# Patient Record
Sex: Male | Born: 1970
Health system: Southern US, Community
[De-identification: ages and names within clinical notes are randomized; demographics above are authoritative.]

## PROBLEM LIST (undated history)

## (undated) DIAGNOSIS — K589 Irritable bowel syndrome without diarrhea: Secondary | ICD-10-CM

## (undated) DIAGNOSIS — D649 Anemia, unspecified: Secondary | ICD-10-CM

## (undated) HISTORY — DX: Anemia, unspecified: D64.9

---

## 1998-09-04 ENCOUNTER — Emergency Department (HOSPITAL_COMMUNITY): Admission: EM | Admit: 1998-09-04 | Discharge: 1998-09-04 | Payer: Self-pay | Admitting: Emergency Medicine

## 2012-09-14 ENCOUNTER — Emergency Department (HOSPITAL_COMMUNITY)
Admission: EM | Admit: 2012-09-14 | Discharge: 2012-09-14 | Disposition: A | Discharge status: Home / Self Care | Payer: Federal, State, Local not specified - PPO | Attending: Emergency Medicine | Admitting: Emergency Medicine

## 2012-09-14 ENCOUNTER — Encounter (HOSPITAL_COMMUNITY): Payer: Self-pay | Admitting: Emergency Medicine

## 2012-09-14 DIAGNOSIS — K921 Melena: Secondary | ICD-10-CM | POA: Insufficient documentation

## 2012-09-14 DIAGNOSIS — K5900 Constipation, unspecified: Secondary | ICD-10-CM | POA: Insufficient documentation

## 2012-09-14 DIAGNOSIS — K625 Hemorrhage of anus and rectum: Secondary | ICD-10-CM | POA: Insufficient documentation

## 2012-09-14 DIAGNOSIS — Z8719 Personal history of other diseases of the digestive system: Secondary | ICD-10-CM | POA: Insufficient documentation

## 2012-09-14 DIAGNOSIS — K649 Unspecified hemorrhoids: Secondary | ICD-10-CM

## 2012-09-14 DIAGNOSIS — Z87891 Personal history of nicotine dependence: Secondary | ICD-10-CM | POA: Insufficient documentation

## 2012-09-14 HISTORY — DX: Irritable bowel syndrome, unspecified: K58.9

## 2012-09-14 LAB — COMPREHENSIVE METABOLIC PANEL
ALT: 34 U/L (ref 0–53)
AST: 29 U/L (ref 0–37)
Albumin: 3.6 g/dL (ref 3.5–5.2)
Alkaline Phosphatase: 123 U/L — ABNORMAL HIGH (ref 39–117)
BUN: 10 mg/dL (ref 6–23)
CO2: 25 mEq/L (ref 19–32)
Calcium: 8.7 mg/dL (ref 8.4–10.5)
Chloride: 104 mEq/L (ref 96–112)
Creatinine, Ser: 1 mg/dL (ref 0.50–1.35)
GFR calc Af Amer: >90 mL/min (ref >90)
GFR calc non Af Amer: >90 mL/min (ref >90)
Glucose, Bld: 98 mg/dL (ref 70–99)
Potassium: 3.5 mEq/L (ref 3.5–5.1)
Sodium: 137 mEq/L (ref 135–145)
Total Bilirubin: 0.2 mg/dL — ABNORMAL LOW (ref 0.3–1.2)
Total Protein: 7.3 g/dL (ref 6.0–8.3)

## 2012-09-14 LAB — CBC WITH DIFFERENTIAL/PLATELET
Basophils Absolute: 0 10*3/uL (ref 0.0–0.1)
Basophils Relative: 0 % (ref 0–1)
Eosinophils Absolute: 0.1 10*3/uL (ref 0.0–0.7)
Eosinophils Relative: 2 % (ref 0–5)
HCT: 35.2 % — ABNORMAL LOW (ref 39.0–52.0)
Hemoglobin: 12.1 g/dL — ABNORMAL LOW (ref 13.0–17.0)
Lymphocytes Relative: 45 % (ref 12–46)
Lymphs Abs: 2.8 10*3/uL (ref 0.7–4.0)
MCH: 25.5 pg — ABNORMAL LOW (ref 26.0–34.0)
MCHC: 34.4 g/dL (ref 30.0–36.0)
MCV: 74.1 fL — ABNORMAL LOW (ref 78.0–100.0)
Monocytes Absolute: 0.6 10*3/uL (ref 0.1–1.0)
Monocytes Relative: 10 % (ref 3–12)
Neutro Abs: 2.7 10*3/uL (ref 1.7–7.7)
Neutrophils Relative %: 43 % (ref 43–77)
Platelets: 249 10*3/uL (ref 150–400)
RBC: 4.75 MIL/uL (ref 4.22–5.81)
RDW: 15.5 % (ref 11.5–15.5)
WBC: 6.3 10*3/uL (ref 4.0–10.5)

## 2012-09-14 LAB — OCCULT BLOOD, POC DEVICE: Fecal Occult Bld: POSITIVE — AB

## 2012-09-14 MED ORDER — POLYETHYLENE GLYCOL 3350 17 GM/SCOOP PO POWD: 17.0000 g | Freq: Every day | ORAL | Status: DC

## 2012-09-14 NOTE — ED Notes (Signed)
Pt has been constipated and began having rectal bleeding about 3 days ago.  Pt states that he thinks the bleeding came from straining and that there was only a little blood on his feces at first but then when his BM was soft, and it did not hurt to go, he still had blood on the tissue.

## 2012-09-14 NOTE — ED Provider Notes (Signed)
History     CSN: [id number]  Arrival date & time 09/14/12  1544   First MD Initiated Contact with Patient 09/14/12 1649      Chief Complaint  Patient presents with  . Rectal Bleeding    (Consider location/radiation/quality/duration/timing/severity/associated sxs/prior treatment) HPI  41 year old male with history of IBS presents with rectal bleeding. Patient reports he has had intermittent bright red blood per rectum for the past 3 or 4 days. Notes that he's been having some constipation and has to strain when he having bowel movement. He notice trace of bright red blood in the toilet bowl. Quantify as maybe 1-2 teaspoon at one time. This occurred each time he had a bowel movement in which he usually has one bowel movement daily. Yesterday he had a normal bowel movement without straining and yet he saw some blood in it. He does complain of some discomfort when he wipes.  Today he noticed some blood on the tissue paper. Otherwise patient denies fever, chills, lightheadedness, dizziness, chest pain, shortness of breath, abdominal pain, nausea vomiting or diarrhea. He denies any recent trauma. He has no history of hemorrhoids. Patient denies any abnormal weight changes.  Past Medical History  Diagnosis Date  . IBS (irritable bowel syndrome)     History reviewed. No pertinent past surgical history.  History reviewed. No pertinent family history.  History  Substance Use Topics  . Smoking status: Former Games developer  . Smokeless tobacco: Not on file  . Alcohol Use: No      Review of Systems  Constitutional: Negative for fever, appetite change and unexpected weight change.  Gastrointestinal: Positive for constipation, blood in stool and rectal pain. Negative for nausea and vomiting.  Genitourinary: Negative for flank pain and testicular pain.  Neurological: Negative for numbness.    Allergies  Review of patient's allergies indicates no known allergies.  Home Medications   Current  Outpatient Rx  Name  Route  Sig  Dispense  Refill  . CLINDINIUM-CHLORDIAZEPOXIDE 2.5-5 MG PO CAPS   Oral   Take 1 capsule by mouth 3 (three) times daily as needed. Ulcer           BP 140/97  Pulse 88  Temp 98.1 F (36.7 C) (Oral)  Resp 18  SpO2 100%  Physical Exam  Nursing note and vitals reviewed. Constitutional: He appears well-developed and well-nourished. No distress.       Awake, alert, nontoxic appearance  HENT:  Head: Atraumatic.  Eyes: Conjunctivae normal are normal. Right eye exhibits no discharge. Left eye exhibits no discharge.  Neck: Normal range of motion. Neck supple.  Cardiovascular: Normal rate and regular rhythm.   Pulmonary/Chest: Effort normal. No respiratory distress. He exhibits no tenderness.  Abdominal: Soft. There is no tenderness. There is no rebound.  Genitourinary: Guaiac positive stool.       Rectum is mildly tender on exam. Some evidence of externally internal hemorrhoid without anal fissure or mass noted. Normal prostate. Hemoccult positive.  Musculoskeletal: He exhibits no tenderness.       ROM appears intact, no obvious focal weakness  Neurological: He is alert.  Skin: Skin is warm and dry. No rash noted.  Psychiatric: He has a normal mood and affect.    ED Course  Procedures (including critical care time)   Labs Reviewed  CBC WITH DIFFERENTIAL  COMPREHENSIVE METABOLIC PANEL   Results for orders placed during the hospital encounter of 09/14/12  CBC WITH DIFFERENTIAL      Component Value Range  WBC 6.3  4.0 - 10.5 K/uL   RBC 4.75  4.22 - 5.81 MIL/uL   Hemoglobin 12.1 (*) 13.0 - 17.0 g/dL   HCT 47.8 (*) 29.5 - 62.1 %   MCV 74.1 (*) 78.0 - 100.0 fL   MCH 25.5 (*) 26.0 - 34.0 pg   MCHC 34.4  30.0 - 36.0 g/dL   RDW 30.8  65.7 - 84.6 %   Platelets 249  150 - 400 K/uL   Neutrophils Relative 43  43 - 77 %   Neutro Abs 2.7  1.7 - 7.7 K/uL   Lymphocytes Relative 45  12 - 46 %   Lymphs Abs 2.8  0.7 - 4.0 K/uL   Monocytes Relative 10   3 - 12 %   Monocytes Absolute 0.6  0.1 - 1.0 K/uL   Eosinophils Relative 2  0 - 5 %   Eosinophils Absolute 0.1  0.0 - 0.7 K/uL   Basophils Relative 0  0 - 1 %   Basophils Absolute 0.0  0.0 - 0.1 K/uL  COMPREHENSIVE METABOLIC PANEL      Component Value Range   Sodium 137  135 - 145 mEq/L   Potassium 3.5  3.5 - 5.1 mEq/L   Chloride 104  96 - 112 mEq/L   CO2 25  19 - 32 mEq/L   Glucose, Bld 98  70 - 99 mg/dL   BUN 10  6 - 23 mg/dL   Creatinine, Ser 9.62  0.50 - 1.35 mg/dL   Calcium 8.7  8.4 - 95.2 mg/dL   Total Protein 7.3  6.0 - 8.3 g/dL   Albumin 3.6  3.5 - 5.2 g/dL   AST 29  0 - 37 U/L   ALT 34  0 - 53 U/L   Alkaline Phosphatase 123 (*) 39 - 117 U/L   Total Bilirubin 0.2 (*) 0.3 - 1.2 mg/dL   GFR calc non Af Amer >90  >90 mL/min   GFR calc Af Amer >90  >90 mL/min  OCCULT BLOOD, POC DEVICE      Component Value Range   Fecal Occult Bld POSITIVE (*) NEGATIVE   No results found.   1. Rectal bleeding  MDM  Patient complains of bright red blood per rectum. He did complain of some constipation and had to strain. On exam he does have evidence of internal and external hemorrhoid, minimal, with guaiac-positive.  He has history of IBS.  His hemoglobin today is 12.1 but otherwise lbs unremarkable. Mildly elevated alkaline phosphatase at 123 but he has no abdominal pain. I would like to refer patient to GI for further evaluation but I anticipate his rectal bleeding is secondary to hemorrhoids.  No significant risk factor for cancer. We'll discharge with stool softener. Patient voiced understanding and agrees with plan.        Fayrene Helper, PA-C 09/14/12 1835

## 2012-09-15 ENCOUNTER — Telehealth: Payer: Self-pay | Admitting: Internal Medicine

## 2012-09-15 NOTE — Telephone Encounter (Signed)
Pt reports he's anxious about the rectal bleeding and wants to make sure nothing's wrong. He prefers not to wait until scheduled visit with Dr Rhea Belton in January, 2014; pt was given an appt with Mike Gip, PA for 09/17/12.

## 2012-09-15 NOTE — ED Provider Notes (Signed)
Medical screening examination/treatment/procedure(s) were performed by non-physician practitioner and as supervising physician I was immediately available for consultation/collaboration.  Lemmie Vanlanen, MD 09/15/12 0028 

## 2012-09-15 NOTE — Telephone Encounter (Signed)
lmom for pt to call back

## 2012-09-17 ENCOUNTER — Ambulatory Visit (INDEPENDENT_AMBULATORY_CARE_PROVIDER_SITE_OTHER): Payer: Federal, State, Local not specified - PPO | Admitting: Physician Assistant

## 2012-09-17 ENCOUNTER — Encounter: Payer: Self-pay | Admitting: Physician Assistant

## 2012-09-17 ENCOUNTER — Other Ambulatory Visit (INDEPENDENT_AMBULATORY_CARE_PROVIDER_SITE_OTHER): Payer: Federal, State, Local not specified - PPO

## 2012-09-17 VITALS — BP 110/78 | HR 81 | Ht 68.0 in | Wt 205.8 lb

## 2012-09-17 DIAGNOSIS — K625 Hemorrhage of anus and rectum: Secondary | ICD-10-CM

## 2012-09-17 DIAGNOSIS — K589 Irritable bowel syndrome without diarrhea: Secondary | ICD-10-CM

## 2012-09-17 DIAGNOSIS — K582 Mixed irritable bowel syndrome: Secondary | ICD-10-CM | POA: Insufficient documentation

## 2012-09-17 DIAGNOSIS — D649 Anemia, unspecified: Secondary | ICD-10-CM

## 2012-09-17 LAB — IBC PANEL
Iron: 25 ug/dL — ABNORMAL LOW (ref 42–165)
Saturation Ratios: 5.8 % — ABNORMAL LOW (ref 20.0–50.0)
Transferrin: 307.5 mg/dL (ref 212.0–360.0)

## 2012-09-17 LAB — FERRITIN: Ferritin: 4.1 ng/mL — ABNORMAL LOW (ref 22.0–322.0)

## 2012-09-17 MED ORDER — MOVIPREP 100 G PO SOLR: 1.0000 | Freq: Once | ORAL | Status: AC

## 2012-09-17 NOTE — Patient Instructions (Addendum)
Please go to the basement level to have your labs drawn.   You have been scheduled for a colonoscopy with propofol. Please follow written instructions given to you at your visit today.  Please pick up your prep kit at the pharmacy within the next 1-3 days. We sent a prescription for the colonoscopy prep to [facility] [address].  We have given you a $20 rebate coupon. If you use inhalers (even only as needed) or a CPAP machine, please bring them with you on the day of your procedure.

## 2012-09-17 NOTE — Progress Notes (Addendum)
Subjective:    Patient ID: Danelle Earthly, male    DOB: 1971-05-29, 41 y.o.   MRN: [id number]  HPI Gedalya is a pleasant 41 year old African American male generally in good health who is new to GI today and referred after an ER visit earlier this week with rectal bleeding. He has history of IBS. He has not had any prior endoscopic evaluation. He said he had onset about a week ago of rectal bleeding which occurred after he had been constipated and had some straining. He says he started seeing some red blood mixed in with his bowel movements NSAID discontinued over a couple of days. On the third day he says he did not see any blood in the next day had a lot of bright red blood noted primarily on the tissue this is tapered off and now over the past couple of days he has not had any rectal bleeding and says his bowel movements are normal. He complains of mild rectal soreness but no rectal pain and says that the pain was never significant . He has no complaints of abdominal pain does get intermittent cramping and bloating with his IBS. His appetite has been fine his weight has been stable. He does donate blood once every 8 weeks and donated about a week before he had this episode of rectal bleeding. His family history is positive for maternal grandmother with polyps, there's no family history of colon cancer. His father does have sickle cell disease He was documented to be Hemoccult positive with his emergency room visit and had labs done showing hemoglobin of 12.1 hematocrit 35.2 MCV of 74 and MCH of 25 platelets 249 seen that was unremarkable with the exception of an alkaline phosphatase slightly elevated at 123    Review of Systems  Constitutional: Negative.   HENT: Negative.   Eyes: Negative.   Respiratory: Negative.   Cardiovascular: Negative.   Gastrointestinal: Positive for blood in stool and rectal pain.  Genitourinary: Negative.   Musculoskeletal: Negative.   Neurological: Negative.    Hematological: Negative.   Psychiatric/Behavioral: Negative.    Outpatient Prescriptions Prior to Visit  Medication Sig Dispense Refill  . clidinium-chlordiazePOXIDE (LIBRAX) 2.5-5 MG per capsule Take 1 capsule by mouth 3 (three) times daily as needed. Ulcer      . polyethylene glycol powder (GLYCOLAX/MIRALAX) powder Take 17 g by mouth daily.  255 g  0   Last reviewed on 09/17/2012  4:20 PM by Sammuel Cooper, PA  No Known Allergies  Patient Active Problem List  Diagnosis  . IBS (irritable bowel syndrome)      family history includes Colon polyps in his maternal grandmother; Diabetes in his maternal grandmother; Irritable bowel syndrome in his maternal grandmother and mother; and Sickle cell anemia in his father.  Objective:   Physical Exam well-developed African American male in no acute distress, pleasant blood pressure 110/78 pulse 81 height 5 foot 8 weight 205. HEENT; nontraumatic normocephalic EOMI PERRLA sclera anicteric conjunctiva are pink,neck; Supple no JVD, Cardiovascular; regular rate and rhythm with S1-S2 no murmur or gallop, Pulmonary; clear bilaterally, Abdomen ;soft nontender nondistended bowel sounds are active there is no palpable mass or hepatosplenomegaly, Rectal; exam not done, he was documented Hemoccult positive in the emergency room, Extremities; no clubbing cyanosis or edema skin warm and dry, Psych; mood and affect normal and appropriate.        Assessment & Plan:  #10 41 year old male with history of IBS now with several day history of rectal bleeding  with bright red blood mixed in with his bowel movements. At this point his bleeding has resolved. He does have a mild microcytic anemia. Bleeding it may have been anorectal secondary to small fissure however given family history and anemia will rule out a  colonic lesion.  Plan; check iron studies today Schedule for colonoscopy with Dr. Alona Bene was discussed in detail with the patient and he is  agreeable to proceed  Addendum: Reviewed and agree with initial management. Beverley Fiedler, MD

## 2012-09-20 ENCOUNTER — Other Ambulatory Visit: Payer: Self-pay | Admitting: *Deleted

## 2012-09-20 MED ORDER — INTEGRA PLUS PO CAPS: ORAL_CAPSULE | ORAL | Status: DC

## 2012-09-29 HISTORY — PX: COLONOSCOPY: SHX174

## 2012-10-08 ENCOUNTER — Ambulatory Visit: Payer: Federal, State, Local not specified - PPO | Admitting: Internal Medicine

## 2012-10-11 ENCOUNTER — Encounter: Payer: Self-pay | Admitting: Internal Medicine

## 2012-10-11 ENCOUNTER — Encounter: Payer: Federal, State, Local not specified - PPO | Admitting: Internal Medicine

## 2012-10-11 ENCOUNTER — Ambulatory Visit (AMBULATORY_SURGERY_CENTER): Payer: Federal, State, Local not specified - PPO | Admitting: Internal Medicine

## 2012-10-11 VITALS — BP 98/85 | HR 88 | Temp 96.8°F | Resp 49 | Ht 68.0 in | Wt 205.0 lb

## 2012-10-11 DIAGNOSIS — D126 Benign neoplasm of colon, unspecified: Secondary | ICD-10-CM

## 2012-10-11 DIAGNOSIS — D649 Anemia, unspecified: Secondary | ICD-10-CM

## 2012-10-11 DIAGNOSIS — K625 Hemorrhage of anus and rectum: Secondary | ICD-10-CM

## 2012-10-11 DIAGNOSIS — D509 Iron deficiency anemia, unspecified: Secondary | ICD-10-CM

## 2012-10-11 DIAGNOSIS — E611 Iron deficiency: Secondary | ICD-10-CM

## 2012-10-11 MED ORDER — SODIUM CHLORIDE 0.9 % IV SOLN: 500.0000 mL | INTRAVENOUS | Status: DC

## 2012-10-11 NOTE — Op Note (Signed)
[facility] [address] [place], [address]   COLONOSCOPY PROCEDURE REPORT  PATIENT: Justin [name], Justin [name]  MR#: [id number] BIRTHDATE: 1970/12/01 , 41  yrs. old GENDER: Male ENDOSCOPIST: Beverley Fiedler, MD REFERRED XB:[id number], Amy PROCEDURE DATE:  10/11/2012 PROCEDURE:   Colonoscopy with cold biopsy polypectomy ASA CLASS:   Class I INDICATIONS:Rectal Bleeding and Iron Deficiency Anemia. MEDICATIONS: MAC sedation, administered by CRNA and propofol (Diprivan) 450mg  IV  DESCRIPTION OF PROCEDURE:   After the risks benefits and alternatives of the procedure were thoroughly explained, informed consent was obtained.  A digital rectal exam revealed no rectal mass.   The LB [id number]-[id number]  endoscope was introduced through the anus and advanced to the cecum, which was identified by both the appendix and ileocecal valve. No adverse events experienced. The quality of the prep was Moviprep fair  The instrument was then slowly withdrawn as the colon was fully examined.    COLON FINDINGS: Three diminutive sessile polyps, measuring 2-3 mm in size, were found in the rectum.  Polypectomy was performed with cold forceps.  All resections were complete and all polyp tissue was completely retrieved.   The colon mucosa was otherwise normal. Retroflexed views revealed small internal hemorrhoids. The time to cecum=11 minutes 45 seconds.  Withdrawal time=14 minutes 13 seconds.  The scope was withdrawn and the procedure completed. COMPLICATIONS: There were no complications.   ENDOSCOPIC IMPRESSION: 1.   Three diminutive sessile polyps, measuring 2-3 mm in size, were found in the rectum; Polypectomy was performed with cold forceps 2.   The colon mucosa was otherwise normal 3.   Small internal hemorrhoids  RECOMMENDATIONS: 1.  Await pathology results 2.  Continue current medications, including oral iron supplementation 3.  Upper endoscopy will be scheduled given iron deficiency  anemia without current explanation   eSigned:  Beverley Fiedler, MD 10/11/2012 10:05 AM  cc: Kasandra Knudsen, The Patient, and Lilyan Punt, MD

## 2012-10-11 NOTE — Progress Notes (Signed)
Patient did not experience any of the following events: a burn prior to discharge; a fall within the facility; wrong site/side/patient/procedure/implant event; or a hospital transfer or hospital admission upon discharge from the facility. [phone]) Patient did not have preoperative order for IV antibiotic SSI prophylaxis. [phone])    Pt with firm abdomen, having problem passing air, pt repositioned right to left and back with some success. Remus Loffler x2 po per protocol. Pt did pass large amt of air, abdomen soft, non distended. ewm

## 2012-10-11 NOTE — Patient Instructions (Addendum)
YOU HAD AN ENDOSCOPIC PROCEDURE TODAY AT THE [facility]: Refer to the procedure report that was given to you for any specific questions about what was found during the examination.  If the procedure report does not answer your questions, please call your gastroenterologist to clarify.  If you requested that your care partner not be given the details of your procedure findings, then the procedure report has been included in a sealed envelope for you to review at your convenience later.  YOU SHOULD EXPECT: Some feelings of bloating in the abdomen. Passage of more gas than usual.  Walking can help get rid of the air that was put into your GI tract during the procedure and reduce the bloating. If you had a lower endoscopy (such as a colonoscopy or flexible sigmoidoscopy) you may notice spotting of blood in your stool or on the toilet paper. If you underwent a bowel prep for your procedure, then you may not have a normal bowel movement for a few days.  DIET: Your first meal following the procedure should be a light meal and then it is ok to progress to your normal diet.  A half-sandwich or bowl of soup is an example of a good first meal.  Heavy or fried foods are harder to digest and may make you feel nauseous or bloated.  Likewise meals heavy in dairy and vegetables can cause extra gas to form and this can also increase the bloating.  Drink plenty of fluids but you should avoid alcoholic beverages for 24 hours.  ACTIVITY: Your care partner should take you home directly after the procedure.  You should plan to take it easy, moving slowly for the rest of the day.  You can resume normal activity the day after the procedure however you should NOT DRIVE or use heavy machinery for 24 hours (because of the sedation medicines used during the test).    SYMPTOMS TO REPORT IMMEDIATELY: A gastroenterologist can be reached at any hour.  During normal business hours, 8:30 AM to 5:00 PM Monday through Friday,  call [phone].  After hours and on weekends, please call the GI answering service at [phone] (EMERGENCY NUMBER) who will take a message and have the physician on call contact you.   Following lower endoscopy (colonoscopy or flexible sigmoidoscopy):  Excessive amounts of blood in the stool  Significant tenderness or worsening of abdominal pains  Swelling of the abdomen that is new, acute  Fever of 100F or higher  FOLLOW UP: If any biopsies were taken you will be contacted by phone or by letter within the next 1-3 weeks.  Call your gastroenterologist if you have not heard about the biopsies in 3 weeks.  Our staff will call the home number listed on your records the next business day following your procedure to check on you and address any questions or concerns that you may have at that time regarding the information given to you following your procedure. This is a courtesy call and so if there is no answer at the home number and we have not heard from you through the emergency physician on call, we will assume that you have returned to your regular daily activities without incident.  SIGNATURES/CONFIDENTIALITY: You and/or your care partner have signed paperwork which will be entered into your electronic medical record.  These signatures attest to the fact that that the information above on your After Visit Summary has been reviewed and is understood.  Full responsibility of the confidentiality  of this discharge information lies with you and/or your care-partner.    Handout on polyps Continue current medicines including oral iron medicine Dr Lauro Franklin office will schedule an endoscopy and call you with the date and time

## 2012-10-12 ENCOUNTER — Telehealth: Payer: Self-pay | Admitting: *Deleted

## 2012-10-12 NOTE — Telephone Encounter (Signed)
  Follow up Call-  Call back number 10/11/2012  Post procedure Call Back phone  # [phone]  Permission to leave phone message Yes     Patient questions:  Do you have a fever, pain , or abdominal swelling? Pain Score  1 *  Have you tolerated food without any problems? yes  Have you been able to return to your normal activities? yes  Do you have any questions about your discharge instructions: Diet   no Medications  no Follow up visit  no  Do you have questions or concerns about your Care? no  Actions: * If pain score is 4 or above: No action needed, pain <4.pt. States that he has slight abd. Distention with "discomfort" noted at 1 on scale of 0-10.  He is able to eat without difficulty.  Advised To call office if discomfort or bloating become problematic.

## 2012-10-14 ENCOUNTER — Encounter: Payer: Self-pay | Admitting: Internal Medicine

## 2012-10-28 ENCOUNTER — Telehealth: Payer: Self-pay | Admitting: *Deleted

## 2012-10-28 NOTE — Telephone Encounter (Signed)
lmom for pt to call back. oer COLON on 10/11/12, pt needs to schedule and EGD d/t iron deficiency anemia.

## 2012-11-15 NOTE — Telephone Encounter (Signed)
Pt never called back. Mailed him a letter asking him to call us to schedule an EGD.

## 2013-01-29 ENCOUNTER — Encounter: Payer: Self-pay | Admitting: *Deleted

## 2013-08-30 ENCOUNTER — Encounter: Payer: Self-pay | Admitting: Family Medicine

## 2013-08-30 ENCOUNTER — Ambulatory Visit (INDEPENDENT_AMBULATORY_CARE_PROVIDER_SITE_OTHER): Payer: Federal, State, Local not specified - PPO | Admitting: Family Medicine

## 2013-08-30 VITALS — BP 132/94 | Ht 68.0 in | Wt 221.0 lb

## 2013-08-30 DIAGNOSIS — K589 Irritable bowel syndrome without diarrhea: Secondary | ICD-10-CM

## 2013-08-30 MED ORDER — CILIDINIUM-CHLORDIAZEPOXIDE 2.5-5 MG PO CAPS: 1.0000 | ORAL_CAPSULE | Freq: Three times a day (TID) | ORAL | Status: DC | PRN

## 2013-08-30 NOTE — Progress Notes (Signed)
   Subjective:    Patient ID: Justin [name], male    DOB: April 18, 1971, 42 y.o.   MRN: [id number]  HPI Patient is here today for a check up. Ongoing flares severe double bowel syndrome. When the strike severe pain. Crampy in nature. Often accompanied by loose stools. At times has to come in late to work because of these. At times has to miss work because of severe pain.  He states he has been having stress in his life, but other than that everything is OK.   Went back to smoking after off for awhile. E cigs helped.   Keeps active with paper route,working at post office, was exercising but fell off with it.   Vit supp  Review of Systems No fever no chills no vomiting no rash no shortness of breath no chest pain ROS otherwise negative    Objective:   Physical Exam Alert no apparent distress vitals stable blood pressure 1:30 or 70 6 repeat. Lungs clear heart regular rate and rhythm H&T normal abdomen soft no discrete tenderness no rebound or guarding       Assessment & Plan:  Impression irritable bowel syndrome rather severe in nature. At times necessitating absence from work. Plan medications refilled. Check every 6 months on this. Also wellness exam encourage. WSL

## 2013-10-14 ENCOUNTER — Ambulatory Visit (INDEPENDENT_AMBULATORY_CARE_PROVIDER_SITE_OTHER): Payer: Federal, State, Local not specified - PPO | Admitting: Family Medicine

## 2013-10-14 ENCOUNTER — Encounter: Payer: Self-pay | Admitting: Family Medicine

## 2013-10-14 VITALS — BP 136/88 | Ht 68.5 in | Wt 219.0 lb

## 2013-10-14 DIAGNOSIS — Z0000 Encounter for general adult medical examination without abnormal findings: Secondary | ICD-10-CM

## 2013-10-14 DIAGNOSIS — Z125 Encounter for screening for malignant neoplasm of prostate: Secondary | ICD-10-CM

## 2013-10-14 DIAGNOSIS — E782 Mixed hyperlipidemia: Secondary | ICD-10-CM

## 2013-10-14 MED ORDER — TRIAMCINOLONE ACETONIDE 0.1 % EX CREA: 1.0000 "application " | TOPICAL_CREAM | Freq: Two times a day (BID) | CUTANEOUS | Status: DC

## 2013-10-14 NOTE — Progress Notes (Signed)
   Subjective:    Patient ID: Justin [name], male    DOB: [birth year], 43 y.o.   MRN: [id number]  HPI Patient arrives for an annual wellness exam. Trying to watch salt  Gets fair amnt of exercise with his jobs. Uses sea salt  Patient stated the last time he gave blood they told him his BP was high and also having problems with fatigue at times.  Patient not exercising regularly other than with work.  Patient states irritable bowel syndrome is generally stable at this time. With flares once a month or so.    Also states the eczema in his ears has gotten bad again and needs med called in.  Pt not a flu shot person  Claims decent variety of foods.  No early family history of prostate or colon cancer. Review of Systems  Constitutional: Negative for fever, activity change and appetite change.  HENT: Negative for congestion and rhinorrhea.   Eyes: Negative for discharge.  Respiratory: Negative for cough and wheezing.   Cardiovascular: Negative for chest pain.  Gastrointestinal: Negative for vomiting, abdominal pain and blood in stool.  Genitourinary: Negative for frequency and difficulty urinating.  Musculoskeletal: Negative for neck pain.  Skin: Negative for rash.  Allergic/Immunologic: Negative for environmental allergies and food allergies.  Neurological: Negative for weakness and headaches.  Psychiatric/Behavioral: Negative for agitation.  All other systems reviewed and are negative.       Objective:   Physical Exam  Vitals reviewed. Constitutional: He appears well-developed and well-nourished.  HENT:  Head: Normocephalic and atraumatic.  Right Ear: External ear normal.  Left Ear: External ear normal.  Nose: Nose normal.  Mouth/Throat: Oropharynx is clear and moist.  Eyes: EOM are normal. Pupils are equal, round, and reactive to light.  Neck: Normal range of motion. Neck supple. No thyromegaly present.  Cardiovascular: Normal rate, regular rhythm and normal heart  sounds.   No murmur heard. Pulmonary/Chest: Effort normal and breath sounds normal. No respiratory distress. He has no wheezes.  Abdominal: Soft. Bowel sounds are normal. He exhibits no distension and no mass. There is no tenderness.  Genitourinary: Penis normal.  Musculoskeletal: Normal range of motion. He exhibits no edema.  Lymphadenopathy:    He has no cervical adenopathy.  Neurological: He is alert. He exhibits normal muscle tone.  Skin: Skin is warm and dry. No erythema.  Psychiatric: He has a normal mood and affect. His behavior is normal. Judgment normal.          Assessment & Plan:  Impression #1 wellness exam #2 borderline blood pressure blood pressure currently good on repeat #3 eczema. #4 herbal bowel syndrome. Plan appropriate blood work. Diet exercise discussed. Check every 6 months with irritable bowel. WSL

## 2013-10-26 DIAGNOSIS — Z0289 Encounter for other administrative examinations: Secondary | ICD-10-CM

## 2013-11-14 LAB — LIPID PANEL
Cholesterol: 126 mg/dL (ref 0–200)
HDL: 37 mg/dL — ABNORMAL LOW (ref >39)
LDL Cholesterol: 69 mg/dL (ref 0–99)
Total CHOL/HDL Ratio: 3.4 Ratio
Triglycerides: 102 mg/dL (ref <150)
VLDL: 20 mg/dL (ref 0–40)

## 2013-11-14 LAB — GLUCOSE, RANDOM: Glucose, Bld: 111 mg/dL — ABNORMAL HIGH (ref 70–99)

## 2013-11-15 LAB — PSA: PSA: 0.8 ng/mL (ref <=4.00)

## 2013-11-16 ENCOUNTER — Encounter: Payer: Self-pay | Admitting: Family Medicine

## 2013-11-21 ENCOUNTER — Other Ambulatory Visit: Payer: Self-pay | Admitting: Family Medicine

## 2013-11-21 MED ORDER — CILIDINIUM-CHLORDIAZEPOXIDE 2.5-5 MG PO CAPS: ORAL_CAPSULE | ORAL | Status: DC

## 2013-11-21 NOTE — Addendum Note (Signed)
Addended by: Jesusita Oka on: 11/21/2013 04:25 PM   Modules accepted: Orders

## 2014-02-06 ENCOUNTER — Other Ambulatory Visit: Payer: Self-pay | Admitting: Family Medicine

## 2014-02-06 NOTE — Telephone Encounter (Signed)
Last seen 1/16

## 2014-05-16 ENCOUNTER — Other Ambulatory Visit: Payer: Self-pay | Admitting: *Deleted

## 2014-05-16 ENCOUNTER — Other Ambulatory Visit: Payer: Self-pay | Admitting: Family Medicine

## 2014-05-26 ENCOUNTER — Other Ambulatory Visit: Payer: Self-pay | Admitting: Family Medicine

## 2014-06-04 ENCOUNTER — Emergency Department (INDEPENDENT_AMBULATORY_CARE_PROVIDER_SITE_OTHER)
Admission: EM | Admit: 2014-06-04 | Discharge: 2014-06-04 | Disposition: A | Discharge status: Home / Self Care | Payer: Federal, State, Local not specified - PPO | Source: Home / Self Care | Attending: Family Medicine | Admitting: Family Medicine

## 2014-06-04 ENCOUNTER — Encounter (HOSPITAL_COMMUNITY): Payer: Self-pay | Admitting: Emergency Medicine

## 2014-06-04 DIAGNOSIS — K0889 Other specified disorders of teeth and supporting structures: Secondary | ICD-10-CM

## 2014-06-04 DIAGNOSIS — I10 Essential (primary) hypertension: Secondary | ICD-10-CM

## 2014-06-04 DIAGNOSIS — K089 Disorder of teeth and supporting structures, unspecified: Secondary | ICD-10-CM

## 2014-06-04 MED ORDER — HYDROCHLOROTHIAZIDE 12.5 MG PO TABS: 12.5000 mg | ORAL_TABLET | Freq: Every day | ORAL | Status: DC

## 2014-06-04 MED ORDER — TRAMADOL HCL 50 MG PO TABS: 50.0000 mg | ORAL_TABLET | Freq: Four times a day (QID) | ORAL | Status: DC | PRN

## 2014-06-04 MED ORDER — AMOXICILLIN 500 MG PO CAPS: 500.0000 mg | ORAL_CAPSULE | Freq: Three times a day (TID) | ORAL | Status: DC

## 2014-06-04 NOTE — Discharge Instructions (Signed)
Thank you for coming in today. Followup with a dentist as soon as possible.  Take tramadol for severe pain.  Take amoxicillin to combat infection.  Start taking hydrochlorothiazide for elevated blood pressure.  Followup with your primary care Dr..  Call or go to the emergency room if you get worse, have trouble breathing, have chest pains, or palpitations.    Justin:  [name] [phone] (ext [phone])  [place]  Please call Dr. Donn Pierini office [phone] or cell [phone] [address] [place] for tooth removal $200 includes exam, Xray, and extraction and follow up visit.  Bring list of current medications with you.   [facility]  [address] [facility]  [place], [place], [place], [address]  [phone], Ext. [phone]  2nd and 4th Thursday of the month at 6:30am (Simple extractions only - no wisdom teeth or surgery) First come/First serve -First 10 clients served  [facility], [place] and [place] residents only)  [address] [place], [place], [address]  [phone]  [place]  [phone]  [facility]  [phone]  Please call [facility] at [phone] to get the details to get your tooth pulled.    Dental Pain A tooth ache may be caused by cavities (tooth decay). Cavities expose the nerve of the tooth to air and hot or cold temperatures. It may come from an infection or abscess (also called a boil or furuncle) around your tooth. It is also often caused by dental caries (tooth decay). This causes the pain you are having. DIAGNOSIS  Your caregiver can diagnose this problem by exam. TREATMENT   If caused by an infection, it may be treated with medications which kill  germs (antibiotics) and pain medications as prescribed by your caregiver. Take medications as directed.  Only take over-the-counter or prescription medicines for pain, discomfort, or fever as directed by your caregiver.  Whether the tooth ache today is caused by infection or dental disease, you should see your dentist as soon as possible for further care. SEEK MEDICAL CARE IF: The exam and treatment you received today has been provided on an emergency basis only. This is not a substitute for complete medical or dental care. If your problem worsens or new problems (symptoms) appear, and you are unable to meet with your dentist, call or return to this location. SEEK IMMEDIATE MEDICAL CARE IF:   You have a fever.  You develop redness and swelling of your face, jaw, or neck.  You are unable to open your mouth.  You have severe pain uncontrolled by pain medicine. MAKE SURE YOU:   Understand these instructions.  Will watch your condition.  Will get help right away if you are not doing well or get worse. Document Released: 09/15/2005 Document Revised: 12/08/2011 Document Reviewed: 05/03/2008 [facility] Patient Information 2015 [place], [place]. This information is not intended to replace advice given to you by your health care provider. Make sure you discuss any questions you have with your health care provider.  Hypertension Hypertension, commonly called high blood pressure, is when the force of blood pumping through your arteries is too strong. Your arteries are the blood vessels that carry blood from your heart throughout your body. A blood pressure reading consists of  a higher number over a lower number, such as 110/72. The higher number (systolic) is the pressure inside your arteries when your heart pumps. The lower number (diastolic) is the pressure inside your arteries when your heart relaxes. Ideally you want your blood pressure below 120/80. Hypertension forces your heart to work harder to  pump blood. Your arteries may become narrow or stiff. Having hypertension puts you at risk for heart disease, stroke, and other problems.  RISK FACTORS Some risk factors for high blood pressure are controllable. Others are not.  Risk factors you cannot control include:   Race. You may be at higher risk if you are African American.  Age. Risk increases with age.  Gender. Men are at higher risk than women before age 68 years. After age 93, women are at higher risk than men. Risk factors you can control include:  Not getting enough exercise or physical activity.  Being overweight.  Getting too much fat, sugar, calories, or salt in your diet.  Drinking too much alcohol. SIGNS AND SYMPTOMS Hypertension does not usually cause signs or symptoms. Extremely high blood pressure (hypertensive crisis) may cause headache, anxiety, shortness of breath, and nosebleed. DIAGNOSIS  To check if you have hypertension, your health care provider will measure your blood pressure while you are seated, with your arm held at the level of your heart. It should be measured at least twice using the same arm. Certain conditions can cause a difference in blood pressure between your right and left arms. A blood pressure reading that is higher than normal on one occasion does not mean that you need treatment. If one blood pressure reading is high, ask your health care provider about having it checked again. TREATMENT  Treating high blood pressure includes making lifestyle changes and possibly taking medicine. Living a healthy lifestyle can help lower high blood pressure. You may need to change some of your habits. Lifestyle changes may include:  Following the DASH diet. This diet is high in fruits, vegetables, and whole grains. It is low in salt, red meat, and added sugars.  Getting at least 2 hours of brisk physical activity every week.  Losing weight if necessary.  Not smoking.  Limiting alcoholic  beverages.  Learning ways to reduce stress. If lifestyle changes are not enough to get your blood pressure under control, your health care provider may prescribe medicine. You may need to take more than one. Work closely with your health care provider to understand the risks and benefits. HOME CARE INSTRUCTIONS  Have your blood pressure rechecked as directed by your health care provider.   Take medicines only as directed by your health care provider. Follow the directions carefully. Blood pressure medicines must be taken as prescribed. The medicine does not work as well when you skip doses. Skipping doses also puts you at risk for problems.   Do not smoke.   Monitor your blood pressure at home as directed by your health care provider. SEEK MEDICAL CARE IF:   You think you are having a reaction to medicines taken.  You have recurrent headaches or feel dizzy.  You have swelling in your ankles.  You have trouble with your vision. SEEK IMMEDIATE MEDICAL CARE IF:  You develop a severe headache or confusion.  You have unusual weakness, numbness, or feel faint.  You have severe chest or abdominal pain.  You vomit repeatedly.  You have trouble breathing. MAKE SURE YOU:   Understand these instructions.  Will watch your condition.  Will get help right away if you are not doing well or get worse. Document Released: 09/15/2005 Document Revised: 01/30/2014 Document Reviewed: 07/08/2013 [facility] Patient Information 2015 [place], [place]. This information is not intended to replace advice given to you by your health care provider. Make sure you discuss any questions you have with your health care provider.

## 2014-06-04 NOTE — ED Provider Notes (Signed)
Justin [name] is a 43 y.o. male who presents to Urgent Care today for mouth pain. Patient has upper incisor pain present for the last several weeks. He's tried peroxide mouthwash which has not helped. He's taking a lot of ibuprofen which helped only a little. He is an appointment with a new dentist in about 3 weeks. No fevers or chills nausea vomiting or diarrhea. No history of hypertension. No chest pains palpitations shortness of breath.   Past Medical History  Diagnosis Date  . IBS (irritable bowel syndrome)   . Anemia    History  Substance Use Topics  . Smoking status: Current Every Day Smoker  . Smokeless tobacco: Never Used  . Alcohol Use: Yes     Comment: occasionally   ROS as above Medications: No current facility-administered medications for this encounter.   Current Outpatient Prescriptions  Medication Sig Dispense Refill  . amoxicillin (AMOXIL) 500 MG capsule Take 1 capsule (500 mg total) by mouth 3 (three) times daily.  30 capsule  0  . hydrochlorothiazide (HYDRODIURIL) 12.5 MG tablet Take 1 tablet (12.5 mg total) by mouth daily.  30 tablet  1  . traMADol (ULTRAM) 50 MG tablet Take 1 tablet (50 mg total) by mouth every 6 (six) hours as needed.  15 tablet  0  . [DISCONTINUED] clidinium-chlordiazePOXIDE (LIBRAX) 5-2.5 MG per capsule TAKE ONE CAPSULE BY MOUTH TWICE A DAY  180 capsule  0    Exam:  BP 166/110  Pulse 63  Temp(Src) 97.7 F (36.5 C) (Oral)  Resp 14  SpO2 100% Gen: Well NAD HEENT: EOMI,  MMM, cracked tooth #10 (upper left lateral incisor) with mild, gum line erythema. The tooth is tender to touch. Lungs: Normal work of breathing. CTABL Heart: RRR no MRG Abd: NABS, Soft. Nondistended, Nontender Exts: Brisk capillary refill, warm and well perfused.   I-STAT chemistry 8 results show sodium of 142, potassium 4, chloride 108, bicarbonate 25, glucose 96, BUN 20, creatinine 1.2, hemoglobin 16.3  No results found for this or any previous visit (from the past  24 hour(s)). No results found.  Assessment and Plan: 43 y.o. male with  1) dental pain due to cracked tooth. Treat with amoxicillin and tramadol. Followup with dentist 2) hypertension: Start hydrochlorothiazide. Avoid NSAIDs if possible.  Discussed warning signs or symptoms. Please see discharge instructions. Patient expresses understanding.   This note was created using Systems analyst. Any transcription errors are unintended.    Gregor Hams, MD 06/04/14 [phone]

## 2014-06-04 NOTE — ED Notes (Signed)
C/O left upper toothache x 3 wks; had regular f/u appt with dentist 2 wks ago - was told not infected, had a cut, cleaned out and felt better.  A few days later, pain returned.  Now pain spreading to nose, area tender to touch.  Denies fevers.  Denies hx HTN "but it's been a little high the last few times I donated blood".  Has been taking 600mg  IBU and rinsing with H2O2.

## 2014-06-06 LAB — POCT I-STAT, CHEM 8
BUN: 20 mg/dL (ref 6–23)
Calcium, Ion: 1.18 mmol/L (ref 1.12–1.23)
Chloride: 108 mEq/L (ref 96–112)
Creatinine, Ser: 1.2 mg/dL (ref 0.50–1.35)
Glucose, Bld: 96 mg/dL (ref 70–99)
HCT: 48 % (ref 39.0–52.0)
Hemoglobin: 16.3 g/dL (ref 13.0–17.0)
Potassium: 4 mEq/L (ref 3.7–5.3)
Sodium: 142 mEq/L (ref 137–147)
TCO2: 25 mmol/L (ref 0–100)

## 2014-08-29 ENCOUNTER — Other Ambulatory Visit: Payer: Self-pay | Admitting: Family Medicine

## 2014-08-30 NOTE — Telephone Encounter (Signed)
Pt to be seen in two d

## 2014-08-30 NOTE — Telephone Encounter (Signed)
Not on med list- last seen 1/15

## 2014-08-31 ENCOUNTER — Other Ambulatory Visit: Payer: Self-pay

## 2014-08-31 MED ORDER — CILIDINIUM-CHLORDIAZEPOXIDE 2.5-5 MG PO CAPS: 1.0000 | ORAL_CAPSULE | Freq: Two times a day (BID) | ORAL | Status: DC

## 2014-08-31 NOTE — Telephone Encounter (Signed)
Ck and see if pt wtill has appt tod or tom, if so will ref then

## 2014-09-01 ENCOUNTER — Encounter: Payer: Self-pay | Admitting: Family Medicine

## 2014-09-01 ENCOUNTER — Ambulatory Visit (INDEPENDENT_AMBULATORY_CARE_PROVIDER_SITE_OTHER): Payer: Federal, State, Local not specified - PPO | Admitting: Family Medicine

## 2014-09-01 VITALS — BP 122/72 | Ht 69.0 in | Wt 213.0 lb

## 2014-09-01 DIAGNOSIS — K589 Irritable bowel syndrome without diarrhea: Secondary | ICD-10-CM

## 2014-09-01 MED ORDER — CILIDINIUM-CHLORDIAZEPOXIDE 2.5-5 MG PO CAPS: 1.0000 | ORAL_CAPSULE | Freq: Two times a day (BID) | ORAL | Status: DC

## 2014-09-01 NOTE — Progress Notes (Signed)
   Subjective:    Patient ID: Justin [name], male    DOB: 12-17-1970, 43 y.o.   MRN: [id number]  HPI Patient is here today for a check up on his IBS.  He said it has been flaring up here lately due to stress.  Now working 2 jobs, feeling fatigued.   More smoking, not exercising as much  internmit abd pain  Librax helps some No longer on the hctz , started on it for elev bp when in the er for dentl psin    Review of Systems No headache no back pain no change in bowel habits no blood in stool ROS otherwise negative    Objective:   Physical Exam  Word vital stable lungs clear. Heart regular in rhythm. H&T normal. Blood pressure excellent off the medications. Abdominal exam benign      Assessment & Plan:  Impression 1 irritable bowel syndrome stable on meds. #2 elevated blood pressure discussed now resolved plan diet exercise discussed medications refilled. Don't forget wellness exams. WSL

## 2014-10-06 ENCOUNTER — Other Ambulatory Visit: Payer: Self-pay | Admitting: Family Medicine

## 2014-10-17 ENCOUNTER — Encounter: Payer: Self-pay | Admitting: Family Medicine

## 2014-10-17 ENCOUNTER — Ambulatory Visit (INDEPENDENT_AMBULATORY_CARE_PROVIDER_SITE_OTHER): Payer: Federal, State, Local not specified - PPO | Admitting: Family Medicine

## 2014-10-17 VITALS — BP 110/70 | Temp 98.2°F | Ht 69.0 in | Wt 211.0 lb

## 2014-10-17 DIAGNOSIS — Z125 Encounter for screening for malignant neoplasm of prostate: Secondary | ICD-10-CM | POA: Diagnosis not present

## 2014-10-17 DIAGNOSIS — K589 Irritable bowel syndrome without diarrhea: Secondary | ICD-10-CM | POA: Diagnosis not present

## 2014-10-17 DIAGNOSIS — Z13220 Encounter for screening for lipoid disorders: Secondary | ICD-10-CM

## 2014-10-17 DIAGNOSIS — Z139 Encounter for screening, unspecified: Secondary | ICD-10-CM

## 2014-10-17 DIAGNOSIS — Z0289 Encounter for other administrative examinations: Secondary | ICD-10-CM

## 2014-10-17 NOTE — Progress Notes (Signed)
   Subjective:    Patient ID: Justin [name], male    DOB: 11/11/1970, 44 y.o.   MRN: [id number]  HPI Patient is here today for a follow up visit on his irritable bowel syndrome.  Patient states that his current medication therapy is working. No other concerns at this time.   Patient needs his FMLA papers updated as a result of today's visit.   Jan to [place] coverage with the fmla,  Needs two visits per yr to make it legal Wonder about a good tine for a wellness exam  Would like to do in April Will work harder on diet and exercise by then  ibs tends to flare up more during  ibs flare  ovrall eating better  Pt notes fatigue and tiredness when working steadily  workign  ng forty plus , During a flare hs to miss a couple days of work, none recently in a few months     Review of Systems No headache no chest pain no weight loss no change in bowel habits ongoing loose stools with stress    Objective:   Physical Exam  Alert vitals stable no acute distress. Lungs clear heart regular rhythm abdominal exam benign      Assessment & Plan:  Impression chronic IBS with intermittent flares. At times requiring time out of work. Plan to fill out FMLA. Diet exercise discussed. Maintain same medications. Follow-up wellness exam in several months. WSL

## 2015-01-18 ENCOUNTER — Encounter: Payer: Federal, State, Local not specified - PPO | Admitting: Family Medicine

## 2015-01-25 ENCOUNTER — Other Ambulatory Visit: Payer: Self-pay | Admitting: Family Medicine

## 2015-01-25 NOTE — Telephone Encounter (Signed)
Please forward to Dr. Lance Morin not familiar with this patient

## 2015-01-26 NOTE — Telephone Encounter (Signed)
Ok plus one ref 

## 2015-02-19 ENCOUNTER — Other Ambulatory Visit: Payer: Self-pay | Admitting: Family Medicine

## 2015-03-10 LAB — LIPID PANEL
Cholesterol: 134 mg/dL (ref 0–200)
HDL: 37 mg/dL — ABNORMAL LOW (ref >=40)
LDL Cholesterol: 82 mg/dL (ref 0–99)
Total CHOL/HDL Ratio: 3.6 Ratio
Triglycerides: 76 mg/dL (ref <150)
VLDL: 15 mg/dL (ref 0–40)

## 2015-03-10 LAB — BASIC METABOLIC PANEL
BUN: 13 mg/dL (ref 6–23)
CO2: 27 mEq/L (ref 19–32)
Calcium: 9.1 mg/dL (ref 8.4–10.5)
Chloride: 105 mEq/L (ref 96–112)
Creat: 1.1 mg/dL (ref 0.50–1.35)
Glucose, Bld: 91 mg/dL (ref 70–99)
Potassium: 4 mEq/L (ref 3.5–5.3)
Sodium: 140 mEq/L (ref 135–145)

## 2015-03-10 LAB — HEPATIC FUNCTION PANEL
ALT: 22 U/L (ref 0–53)
AST: 18 U/L (ref 0–37)
Albumin: 3.9 g/dL (ref 3.5–5.2)
Alkaline Phosphatase: 118 U/L — ABNORMAL HIGH (ref 39–117)
Bilirubin, Direct: 0.1 mg/dL (ref 0.0–0.3)
Indirect Bilirubin: 0.4 mg/dL (ref 0.2–1.2)
Total Bilirubin: 0.5 mg/dL (ref 0.2–1.2)
Total Protein: 7.3 g/dL (ref 6.0–8.3)

## 2015-03-10 LAB — PSA: PSA: 0.69 ng/mL (ref <=4.00)

## 2015-03-21 ENCOUNTER — Ambulatory Visit (INDEPENDENT_AMBULATORY_CARE_PROVIDER_SITE_OTHER): Payer: Federal, State, Local not specified - PPO | Admitting: Family Medicine

## 2015-03-21 ENCOUNTER — Encounter: Payer: Self-pay | Admitting: Family Medicine

## 2015-03-21 VITALS — BP 120/80 | Ht 68.0 in | Wt 207.2 lb

## 2015-03-21 DIAGNOSIS — K589 Irritable bowel syndrome without diarrhea: Secondary | ICD-10-CM | POA: Diagnosis not present

## 2015-03-21 DIAGNOSIS — Z0000 Encounter for general adult medical examination without abnormal findings: Secondary | ICD-10-CM | POA: Diagnosis not present

## 2015-03-21 DIAGNOSIS — K921 Melena: Secondary | ICD-10-CM | POA: Diagnosis not present

## 2015-03-21 MED ORDER — CILIDINIUM-CHLORDIAZEPOXIDE 2.5-5 MG PO CAPS: 1.0000 | ORAL_CAPSULE | Freq: Two times a day (BID) | ORAL | Status: DC

## 2015-03-21 NOTE — Patient Instructions (Addendum)
miralax one scoop daily as needed for constipation Results for orders placed or performed in visit on 10/17/14  Lipid panel  Result Value Ref Range   Cholesterol 134 0 - 200 mg/dL   Triglycerides 76 <150 mg/dL   HDL 37 (L) >=40 mg/dL   Total CHOL/HDL Ratio 3.6 Ratio   VLDL 15 0 - 40 mg/dL   LDL Cholesterol 82 0 - 99 mg/dL  Hepatic function panel  Result Value Ref Range   Total Bilirubin 0.5 0.2 - 1.2 mg/dL   Bilirubin, Direct 0.1 0.0 - 0.3 mg/dL   Indirect Bilirubin 0.4 0.2 - 1.2 mg/dL   Alkaline Phosphatase 118 (H) 39 - 117 U/L   AST 18 0 - 37 U/L   ALT 22 0 - 53 U/L   Total Protein 7.3 6.0 - 8.3 g/dL   Albumin 3.9 3.5 - 5.2 g/dL  Basic metabolic panel  Result Value Ref Range   Sodium 140 135 - 145 mEq/L   Potassium 4.0 3.5 - 5.3 mEq/L   Chloride 105 96 - 112 mEq/L   CO2 27 19 - 32 mEq/L   Glucose, Bld 91 70 - 99 mg/dL   BUN 13 6 - 23 mg/dL   Creat 1.10 0.50 - 1.35 mg/dL   Calcium 9.1 8.4 - 10.5 mg/dL  PSA  Result Value Ref Range   PSA 0.69 <=4.00 ng/mL

## 2015-03-21 NOTE — Addendum Note (Signed)
Addended by: Jesusita Oka on: 03/21/2015 04:08 PM   Modules accepted: Orders

## 2015-03-21 NOTE — Progress Notes (Addendum)
Subjective:    Patient ID: Justin [name], male    DOB: 1971/01/31, 44 y.o.   MRN: [id number]  HPI The patient comes in today for a wellness visit.    A review of their health history was completed.  A review of medications was also completed.  Any needed refills: none   Eating habits: good  Falls/  MVA accidents in past few months: none  Regular exercise: yes, fairly regular and trying not to miss a session, three to four times per wk   Specialist pt sees on regular basis: none  Preventative health issues were discussed.   Additional concerns: Patient states that his bowel movements have been hard over the past month. Rectal bleeding was noted on a few occasions.   Results for orders placed or performed in visit on 10/17/14  Lipid panel  Result Value Ref Range   Cholesterol 134 0 - 200 mg/dL   Triglycerides 76 <150 mg/dL   HDL 37 (L) >=40 mg/dL   Total CHOL/HDL Ratio 3.6 Ratio   VLDL 15 0 - 40 mg/dL   LDL Cholesterol 82 0 - 99 mg/dL  Hepatic function panel  Result Value Ref Range   Total Bilirubin 0.5 0.2 - 1.2 mg/dL   Bilirubin, Direct 0.1 0.0 - 0.3 mg/dL   Indirect Bilirubin 0.4 0.2 - 1.2 mg/dL   Alkaline Phosphatase 118 (H) 39 - 117 U/L   AST 18 0 - 37 U/L   ALT 22 0 - 53 U/L   Total Protein 7.3 6.0 - 8.3 g/dL   Albumin 3.9 3.5 - 5.2 g/dL  Basic metabolic panel  Result Value Ref Range   Sodium 140 135 - 145 mEq/L   Potassium 4.0 3.5 - 5.3 mEq/L   Chloride 105 96 - 112 mEq/L   CO2 27 19 - 32 mEq/L   Glucose, Bld 91 70 - 99 mg/dL   BUN 13 6 - 23 mg/dL   Creat 1.10 0.50 - 1.35 mg/dL   Calcium 9.1 8.4 - 10.5 mg/dL  PSA  Result Value Ref Range   PSA 0.69 <=4.00 ng/mL   b w wa s fasting Blood in stool when b m was hard, now notice s spfter stools and hadling things better,  On ongoing challenges with IBS and pain  Review of Systems  Constitutional: Negative for fever, activity change and appetite change.  HENT: Negative for congestion and rhinorrhea.    Eyes: Negative for discharge.  Respiratory: Negative for cough and wheezing.   Cardiovascular: Negative for chest pain.  Gastrointestinal: Negative for vomiting, abdominal pain and blood in stool.  Genitourinary: Negative for frequency and difficulty urinating.  Musculoskeletal: Negative for neck pain.  Skin: Negative for rash.  Allergic/Immunologic: Negative for environmental allergies and food allergies.  Neurological: Negative for weakness and headaches.  Psychiatric/Behavioral: Negative for agitation.  All other systems reviewed and are negative.      Objective:   Physical Exam  Constitutional: He appears well-developed and well-nourished.  HENT:  Head: Normocephalic and atraumatic.  Right Ear: External ear normal.  Left Ear: External ear normal.  Nose: Nose normal.  Mouth/Throat: Oropharynx is clear and moist.  Eyes: EOM are normal. Pupils are equal, round, and reactive to light.  Neck: Normal range of motion. Neck supple. No thyromegaly present.  Cardiovascular: Normal rate, regular rhythm and normal heart sounds.   No murmur heard. Pulmonary/Chest: Effort normal and breath sounds normal. No respiratory distress. He has no wheezes.  Abdominal: Soft. Bowel sounds are  normal. He exhibits no distension and no mass. There is no tenderness.  Genitourinary: Penis normal.  Musculoskeletal: Normal range of motion. He exhibits no edema.  Lymphadenopathy:    He has no cervical adenopathy.  Neurological: He is alert. He exhibits normal muscle tone.  Skin: Skin is warm and dry. No erythema.  Psychiatric: He has a normal mood and affect. His behavior is normal. Judgment normal.  Vitals reviewed.         Assessment & Plan:  Impression 1 wellness exam #2 hematochezia brief in nature. Associated with spell of constipation. Which can occur with patient's IBS. Since negative colonoscopy just 2 years ago field that further workup not advise and less bleeding continues #3 IBS stable  on medications plan add Muro lax when necessary for constipation. Diet exercise discussed recheck in 6 months WSL

## 2015-05-14 ENCOUNTER — Encounter: Payer: Self-pay | Admitting: Family Medicine

## 2015-05-14 ENCOUNTER — Ambulatory Visit (INDEPENDENT_AMBULATORY_CARE_PROVIDER_SITE_OTHER): Payer: Federal, State, Local not specified - PPO | Admitting: Family Medicine

## 2015-05-14 VITALS — BP 130/84 | Ht 68.0 in | Wt 210.2 lb

## 2015-05-14 DIAGNOSIS — Z0289 Encounter for other administrative examinations: Secondary | ICD-10-CM

## 2015-05-14 DIAGNOSIS — K589 Irritable bowel syndrome without diarrhea: Secondary | ICD-10-CM

## 2015-05-14 NOTE — Progress Notes (Signed)
   Subjective:    Patient ID: Justin [name], male    DOB: 1971/04/09, 44 y.o.   MRN: [id number]  HPI  Patient in today for IBS. Patient has c/o abdominal cramping, constipation, diarrhea, and abdominal pain. Patient states that he has had symptoms for several years.  Lot of increased stress lately  Feels this is impacting on the gi situatiion Patient would also like to discuss FMLA.Patient states no other concerns this visit.   Results for orders placed or performed in visit on 10/17/14  Lipid panel  Result Value Ref Range   Cholesterol 134 0 - 200 mg/dL   Triglycerides 76 <150 mg/dL   HDL 37 (L) >=40 mg/dL   Total CHOL/HDL Ratio 3.6 Ratio   VLDL 15 0 - 40 mg/dL   LDL Cholesterol 82 0 - 99 mg/dL  Hepatic function panel  Result Value Ref Range   Total Bilirubin 0.5 0.2 - 1.2 mg/dL   Bilirubin, Direct 0.1 0.0 - 0.3 mg/dL   Indirect Bilirubin 0.4 0.2 - 1.2 mg/dL   Alkaline Phosphatase 118 (H) 39 - 117 U/L   AST 18 0 - 37 U/L   ALT 22 0 - 53 U/L   Total Protein 7.3 6.0 - 8.3 g/dL   Albumin 3.9 3.5 - 5.2 g/dL  Basic metabolic panel  Result Value Ref Range   Sodium 140 135 - 145 mEq/L   Potassium 4.0 3.5 - 5.3 mEq/L   Chloride 105 96 - 112 mEq/L   CO2 27 19 - 32 mEq/L   Glucose, Bld 91 70 - 99 mg/dL   BUN 13 6 - 23 mg/dL   Creat 1.10 0.50 - 1.35 mg/dL   Calcium 9.1 8.4 - 10.5 mg/dL  PSA  Result Value Ref Range   PSA 0.69 <=4.00 ng/mL   Missing generally one or two days with flare of the symptoms. Reports 3-4 times per month.  Also delayed at times with the pain and then the b m, then sometimes has to leave early "getting off the phone", pt needs also accomodation for this for lateness, approx three to four d per wk  Patient states approximately 1 bowel movement every couple days. Sometimes was sometimes hard. Heart and crampy in nature.  Patient goes into great length to request a dual FMLA    Review of Systems No weight loss no headache no chest pain no back pain no  current abdominal pain    Objective:   Physical Exam  Alert no acute distress. HEENT normal. Lungs clear heart rare rhythm abdomen benign      Assessment & Plan:  Impression irritable bowel syndrome #2 very detail request regarding FMLA. 25 minutes spent with patient mostly in discussion plan we will attempt to craft FMLA in accordance with patient wishes. Advised patient this may be too much for his employer to accommodate reasonably. Patient aware of this. WSL

## 2015-05-28 ENCOUNTER — Telehealth: Payer: Self-pay | Admitting: Family Medicine

## 2015-05-28 NOTE — Telephone Encounter (Signed)
Will be ready tomorrow, unfortunately you cannot do this because of pt's unusual requests

## 2015-05-28 NOTE — Telephone Encounter (Signed)
Patient called needing FMLA by 8/30 he stated they were dropped off 8/15.I told him sorry for delay waiting on doctor to send upfront.

## 2015-06-11 ENCOUNTER — Ambulatory Visit: Payer: Federal, State, Local not specified - PPO | Admitting: Family Medicine

## 2015-06-13 ENCOUNTER — Other Ambulatory Visit: Payer: Self-pay | Admitting: Family Medicine

## 2015-06-14 NOTE — Telephone Encounter (Signed)
Ok 6 mo 

## 2015-06-15 ENCOUNTER — Other Ambulatory Visit: Payer: Self-pay

## 2015-06-29 ENCOUNTER — Other Ambulatory Visit: Payer: Self-pay | Admitting: Family Medicine

## 2015-07-02 NOTE — Telephone Encounter (Signed)
Ok six mo worth 

## 2015-07-03 ENCOUNTER — Telehealth: Payer: Self-pay | Admitting: Family Medicine

## 2015-07-03 NOTE — Telephone Encounter (Signed)
[facility] to find out why he is taking more than prescribed.

## 2015-07-03 NOTE — Telephone Encounter (Signed)
clidinium-chlordiazePOXIDE (LIBRAX) 5-2.5 MG capsule  Pt states he has taken more than he was supposed to an needs  An early refill, aware insurance may not pay for this an he may have  To pay out of pocket    [facility] [place]

## 2015-07-05 NOTE — Telephone Encounter (Signed)
Pt prescribed one bid but has been taking 2 bid. He states he has been under more stress and that why he increased dose

## 2015-07-05 NOTE — Telephone Encounter (Signed)
Ok can change to two bid and adjust rx's , notify pt and let him know do not exceed

## 2015-07-05 NOTE — Telephone Encounter (Signed)
This would need to be reviewed by Dr. Richardson Landry on Friday morning when he comes in thank you

## 2015-07-06 ENCOUNTER — Other Ambulatory Visit: Payer: Self-pay | Admitting: *Deleted

## 2015-07-06 MED ORDER — CILIDINIUM-CHLORDIAZEPOXIDE 2.5-5 MG PO CAPS: ORAL_CAPSULE | ORAL | Status: DC

## 2015-07-06 MED ORDER — CILIDINIUM-CHLORDIAZEPOXIDE 2.5-5 MG PO CAPS
ORAL_CAPSULE | ORAL | Status: DC
Start: 2015-07-06 — End: 2015-12-03

## 2015-07-06 NOTE — Telephone Encounter (Signed)
done

## 2015-07-06 NOTE — Addendum Note (Signed)
Addended by: Launa Grill on: 07/06/2015 09:16 AM   Modules accepted: Orders

## 2015-07-06 NOTE — Addendum Note (Signed)
Addended by: Launa Grill on: 07/06/2015 09:14 AM   Modules accepted: Orders, Medications

## 2015-07-13 ENCOUNTER — Other Ambulatory Visit: Payer: Self-pay | Admitting: Family Medicine

## 2015-12-03 ENCOUNTER — Ambulatory Visit (INDEPENDENT_AMBULATORY_CARE_PROVIDER_SITE_OTHER): Payer: Federal, State, Local not specified - PPO | Admitting: Family Medicine

## 2015-12-03 ENCOUNTER — Encounter: Payer: Self-pay | Admitting: Family Medicine

## 2015-12-03 VITALS — BP 122/82 | Ht 68.0 in | Wt 214.6 lb

## 2015-12-03 DIAGNOSIS — K589 Irritable bowel syndrome without diarrhea: Secondary | ICD-10-CM

## 2015-12-03 MED ORDER — CILIDINIUM-CHLORDIAZEPOXIDE 2.5-5 MG PO CAPS: ORAL_CAPSULE | ORAL | Status: DC

## 2015-12-03 NOTE — Progress Notes (Signed)
   Subjective:    Patient ID: Justin [name], male    DOB: Jun 12, 1971, 45 y.o.   MRN: [id number]  HPI Patient arrives for a follow up on IBS. Patient states he is doing well on current meds and needs a refill.  IBS up and down, nmeds definitely helps  Uses Librax faithfully., Perhaps very slight drowsiness at times with meds. Definitely helps some symptoms from the IBS  Employer honored limitations with need for frequent b m 's   Diet is ok   Review of Systems No headache, no major weight loss or weight gain, no chest pain no back pain abdominal pain no change in bowel habits complete ROS otherwise negative     Objective:   Physical Exam  Alert no acute distress lungs clear heart rare rhythm H&T normal vitals stable abdomen soft no discrete tenderness day no rebound no guarding      Assessment & Plan:  Impression chronic IBS discussed plan medications refilled. Diet exercise avoidance measures discussed WSL

## 2015-12-24 ENCOUNTER — Other Ambulatory Visit: Payer: Self-pay | Admitting: Family Medicine

## 2016-02-26 ENCOUNTER — Other Ambulatory Visit: Payer: Self-pay | Admitting: Family Medicine

## 2016-02-27 ENCOUNTER — Telehealth: Payer: Self-pay | Admitting: Family Medicine

## 2016-02-27 MED ORDER — CILIDINIUM-CHLORDIAZEPOXIDE 2.5-5 MG PO CAPS: 1.0000 | ORAL_CAPSULE | Freq: Two times a day (BID) | ORAL | Status: DC

## 2016-02-27 NOTE — Telephone Encounter (Signed)
Yes plus three ref 

## 2016-02-27 NOTE — Telephone Encounter (Signed)
Prescription printed. Medication to be faxed to pharmacy.

## 2016-02-27 NOTE — Telephone Encounter (Signed)
Patient requesting refill on Librax. May we refill?

## 2016-03-21 DIAGNOSIS — K080 Exfoliation of teeth due to systemic causes: Secondary | ICD-10-CM | POA: Diagnosis not present

## 2016-04-18 ENCOUNTER — Encounter: Payer: Self-pay | Admitting: Nurse Practitioner

## 2016-04-18 ENCOUNTER — Ambulatory Visit: Payer: Federal, State, Local not specified - PPO | Admitting: Nurse Practitioner

## 2016-04-18 ENCOUNTER — Ambulatory Visit (INDEPENDENT_AMBULATORY_CARE_PROVIDER_SITE_OTHER): Payer: Federal, State, Local not specified - PPO | Admitting: Nurse Practitioner

## 2016-04-18 VITALS — BP 132/82 | Ht 68.0 in | Wt 201.2 lb

## 2016-04-18 DIAGNOSIS — K589 Irritable bowel syndrome without diarrhea: Secondary | ICD-10-CM

## 2016-04-18 DIAGNOSIS — K080 Exfoliation of teeth due to systemic causes: Secondary | ICD-10-CM | POA: Diagnosis not present

## 2016-04-18 DIAGNOSIS — Z029 Encounter for administrative examinations, unspecified: Secondary | ICD-10-CM

## 2016-04-19 ENCOUNTER — Encounter: Payer: Self-pay | Admitting: Nurse Practitioner

## 2016-04-19 NOTE — Progress Notes (Signed)
Subjective:  Presents for recheck on his IBS. Has classic form with alternating cycles of constipation and diarrhea. Increased stress and certain foods cause loose stools which can cause problems with his work schedule. Abdominal pain and cramping at times improved with Librax. No fever or blood in stools. Eats diet high in fiber which has helped. No family history of colon cancer.   Objective:   BP 132/82 mmHg  Ht 5\' 8"  (1.727 m)  Wt 201 lb 3.2 oz (91.264 kg)  BMI 30.60 kg/m2 NAD. Alert, oriented. Lungs clear. Heart RRR. Abdomen soft, non distended with active BS x 4. No tenderness or masses.   Assessment:  Problem List Items Addressed This Visit      Digestive   IBS (irritable bowel syndrome) - Primary     Plan: continue current regimen.  Return in about 1 year (around 04/18/2017) for recheck.

## 2016-04-28 ENCOUNTER — Telehealth: Payer: Self-pay | Admitting: Family Medicine

## 2016-04-28 NOTE — Telephone Encounter (Signed)
°(  Message for [facility]) Patient has called twice to see if FMLA was finished .I told him I will call when its completed.

## 2016-04-29 NOTE — Telephone Encounter (Signed)
Done. Given to [place] to notify patient.

## 2016-05-02 DIAGNOSIS — K080 Exfoliation of teeth due to systemic causes: Secondary | ICD-10-CM | POA: Diagnosis not present

## 2016-05-12 ENCOUNTER — Telehealth: Payer: Self-pay | Admitting: Nurse Practitioner

## 2016-05-12 NOTE — Telephone Encounter (Signed)
Patient states that his FMLA needed to be more detailed and he would like to discuss this with Hoyle Sauer.  Please advise.

## 2016-05-14 NOTE — Telephone Encounter (Signed)
Ok. I used the 2 of the previous ones we had done. Will be glad to change form as needed. Can he drop off the form with a note about what they request?

## 2016-05-14 NOTE — Telephone Encounter (Signed)
Patient states that he will drop this off on Friday.

## 2016-05-20 ENCOUNTER — Other Ambulatory Visit: Payer: Self-pay | Admitting: Family Medicine

## 2016-05-23 DIAGNOSIS — K080 Exfoliation of teeth due to systemic causes: Secondary | ICD-10-CM | POA: Diagnosis not present

## 2016-06-01 ENCOUNTER — Other Ambulatory Visit: Payer: Self-pay | Admitting: Family Medicine

## 2016-06-04 ENCOUNTER — Encounter: Payer: Federal, State, Local not specified - PPO | Admitting: Family Medicine

## 2016-07-18 ENCOUNTER — Encounter: Payer: Self-pay | Admitting: Family Medicine

## 2016-07-18 ENCOUNTER — Ambulatory Visit (INDEPENDENT_AMBULATORY_CARE_PROVIDER_SITE_OTHER): Payer: Federal, State, Local not specified - PPO | Admitting: Family Medicine

## 2016-07-18 VITALS — BP 110/74 | Ht 67.75 in | Wt 199.2 lb

## 2016-07-18 DIAGNOSIS — Z125 Encounter for screening for malignant neoplasm of prostate: Secondary | ICD-10-CM | POA: Diagnosis not present

## 2016-07-18 DIAGNOSIS — Z13220 Encounter for screening for lipoid disorders: Secondary | ICD-10-CM

## 2016-07-18 DIAGNOSIS — Z79899 Other long term (current) drug therapy: Secondary | ICD-10-CM

## 2016-07-18 DIAGNOSIS — I10 Essential (primary) hypertension: Secondary | ICD-10-CM

## 2016-07-18 DIAGNOSIS — Z0000 Encounter for general adult medical examination without abnormal findings: Secondary | ICD-10-CM

## 2016-07-18 DIAGNOSIS — K582 Mixed irritable bowel syndrome: Secondary | ICD-10-CM

## 2016-07-18 MED ORDER — TRIAMCINOLONE ACETONIDE 0.1 % EX CREA: TOPICAL_CREAM | CUTANEOUS | 5 refills | Status: DC

## 2016-07-18 MED ORDER — CILIDINIUM-CHLORDIAZEPOXIDE 2.5-5 MG PO CAPS: 1.0000 | ORAL_CAPSULE | Freq: Two times a day (BID) | ORAL | 5 refills | Status: DC

## 2016-07-18 NOTE — Progress Notes (Signed)
Subjective:    Patient ID: Justin [name], male    DOB: [birth year], 45 y.o.   MRN: [id number]  HPI The patient comes in today for a wellness visit.   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  Results for orders placed or performed in visit on 10/17/14  Lipid panel  Result Value Ref Range   Cholesterol 134 0 - 200 mg/dL   Triglycerides 76 <150 mg/dL   HDL 37 (L) >=40 mg/dL   Total CHOL/HDL Ratio 3.6 Ratio   VLDL 15 0 - 40 mg/dL   LDL Cholesterol 82 0 - 99 mg/dL  Hepatic function panel  Result Value Ref Range   Total Bilirubin 0.5 0.2 - 1.2 mg/dL   Bilirubin, Direct 0.1 0.0 - 0.3 mg/dL   Indirect Bilirubin 0.4 0.2 - 1.2 mg/dL   Alkaline Phosphatase 118 (H) 39 - 117 U/L   AST 18 0 - 37 U/L   ALT 22 0 - 53 U/L   Total Protein 7.3 6.0 - 8.3 g/dL   Albumin 3.9 3.5 - 5.2 g/dL  Basic metabolic panel  Result Value Ref Range   Sodium 140 135 - 145 mEq/L   Potassium 4.0 3.5 - 5.3 mEq/L   Chloride 105 96 - 112 mEq/L   CO2 27 19 - 32 mEq/L   Glucose, Bld 91 70 - 99 mg/dL   BUN 13 6 - 23 mg/dL   Creat 1.10 0.50 - 1.35 mg/dL   Calcium 9.1 8.4 - 10.5 mg/dL  PSA  Result Value Ref Range   PSA 0.69 <=4.00 ng/mL   Eating diet, eating salada and eating better overall.   Working on adding a cardio   A review of their health history was completed.  A review of medications was also completed.  Any needed refills; yes on librax . Ongoing challenges with irritable bowel syndrome. Renders him unable to start his job exactly when he wants to. 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 No obvious side effects with medicine. States it definitely helps. Needs to take it regularly  and triamcinolone  Eating habits: health conscious  Falls/  MVA accidents in past few months: none  Regular exercise: 3 -4 times a week - pushups, abs and squats  Specialist pt sees on regular basis: none  Preventative health issues were discussed.   Additional concerns:  none   Review of Systems  Constitutional: Negative for activity change, appetite change and fever.  HENT: Negative for congestion and rhinorrhea.   Eyes: Negative for discharge.  Respiratory: Negative for cough and wheezing.   Cardiovascular: Negative for chest pain.  Gastrointestinal: Negative for abdominal pain, blood in stool and vomiting.  Genitourinary: Negative for difficulty urinating and frequency.  Musculoskeletal: Negative for neck pain.  Skin: Negative for rash.  Allergic/Immunologic: Negative for environmental allergies and food allergies.  Neurological: Negative for weakness and headaches.  Psychiatric/Behavioral: Negative for agitation.  All other systems reviewed and are negative.      Objective:   Physical Exam  Constitutional: He appears well-developed and well-nourished.  HENT:  Head: Normocephalic and atraumatic.  Right Ear: External ear normal.  Left Ear: External ear normal.  Nose: Nose normal.  Mouth/Throat: Oropharynx is clear and moist.  Eyes: EOM are normal. Pupils are equal, round, and reactive to light.  Neck: Normal range of motion. Neck supple. No thyromegaly present.  Cardiovascular: Normal rate, regular rhythm and normal heart sounds.   No murmur heard. Pulmonary/Chest: Effort normal  and breath sounds normal. No respiratory distress. He has no wheezes.  Abdominal: Soft. Bowel sounds are normal. He exhibits no distension and no mass. There is no tenderness.  Genitourinary: Penis normal.  Musculoskeletal: Normal range of motion. He exhibits no edema.  Lymphadenopathy:    He has no cervical adenopathy.  Neurological: He is alert. He exhibits normal muscle tone.  Skin: Skin is warm and dry. No erythema.  Psychiatric: He has a normal mood and affect. His behavior is normal. Judgment normal.  Vitals reviewed.  Prostate within normal limits       Assessment & Plan:  Impression well adult exam. Diet discussed. Exercise discussed. Work  performance discussed #2 irritable bowel syndrome. Ongoing. A definite need for medications. Discussed will refill and maintain rationale discussed appropriate blood work further recommendations based on results

## 2016-07-19 LAB — HEPATIC FUNCTION PANEL
ALT: 26 IU/L (ref 0–44)
AST: 21 IU/L (ref 0–40)
Albumin: 4.2 g/dL (ref 3.5–5.5)
Alkaline Phosphatase: 113 IU/L (ref 39–117)
Bilirubin Total: 0.4 mg/dL (ref 0.0–1.2)
Bilirubin, Direct: 0.11 mg/dL (ref 0.00–0.40)
Total Protein: 7.3 g/dL (ref 6.0–8.5)

## 2016-07-19 LAB — LIPID PANEL
Chol/HDL Ratio: 3.5 ratio units (ref 0.0–5.0)
Cholesterol, Total: 156 mg/dL (ref 100–199)
HDL: 44 mg/dL (ref >39)
LDL Calculated: 81 mg/dL (ref 0–99)
Triglycerides: 155 mg/dL — ABNORMAL HIGH (ref 0–149)
VLDL Cholesterol Cal: 31 mg/dL (ref 5–40)

## 2016-07-19 LAB — BASIC METABOLIC PANEL
BUN/Creatinine Ratio: 10 (ref 9–20)
BUN: 10 mg/dL (ref 6–24)
CO2: 24 mmol/L (ref 18–29)
Calcium: 8.9 mg/dL (ref 8.7–10.2)
Chloride: 103 mmol/L (ref 96–106)
Creatinine, Ser: 1.02 mg/dL (ref 0.76–1.27)
GFR calc Af Amer: 103 mL/min/{1.73_m2} (ref >59)
GFR calc non Af Amer: 89 mL/min/{1.73_m2} (ref >59)
Glucose: 106 mg/dL — ABNORMAL HIGH (ref 65–99)
Potassium: 4.1 mmol/L (ref 3.5–5.2)
Sodium: 143 mmol/L (ref 134–144)

## 2016-07-19 LAB — PSA: Prostate Specific Ag, Serum: 0.8 ng/mL (ref 0.0–4.0)

## 2016-07-20 ENCOUNTER — Encounter: Payer: Self-pay | Admitting: Family Medicine

## 2016-09-02 ENCOUNTER — Other Ambulatory Visit: Payer: Self-pay | Admitting: Family Medicine

## 2016-11-27 ENCOUNTER — Other Ambulatory Visit: Payer: Self-pay | Admitting: Family Medicine

## 2016-11-27 NOTE — Telephone Encounter (Signed)
Patient last seen October 2017

## 2016-12-11 DIAGNOSIS — S0501XA Injury of conjunctiva and corneal abrasion without foreign body, right eye, initial encounter: Secondary | ICD-10-CM | POA: Diagnosis not present

## 2016-12-22 ENCOUNTER — Other Ambulatory Visit: Payer: Self-pay | Admitting: Family Medicine

## 2017-01-05 NOTE — Telephone Encounter (Signed)
Sixty only o v necessary for six mo f u this is a controlled substance

## 2017-01-16 ENCOUNTER — Ambulatory Visit (INDEPENDENT_AMBULATORY_CARE_PROVIDER_SITE_OTHER): Payer: Federal, State, Local not specified - PPO | Admitting: Family Medicine

## 2017-01-16 ENCOUNTER — Encounter: Payer: Self-pay | Admitting: Family Medicine

## 2017-01-16 VITALS — BP 122/80 | Ht 68.0 in | Wt 223.0 lb

## 2017-01-16 DIAGNOSIS — S0083XA Contusion of other part of head, initial encounter: Secondary | ICD-10-CM | POA: Diagnosis not present

## 2017-01-16 DIAGNOSIS — R05 Cough: Secondary | ICD-10-CM

## 2017-01-16 DIAGNOSIS — K582 Mixed irritable bowel syndrome: Secondary | ICD-10-CM

## 2017-01-16 DIAGNOSIS — R058 Other specified cough: Secondary | ICD-10-CM

## 2017-01-16 MED ORDER — CILIDINIUM-CHLORDIAZEPOXIDE 2.5-5 MG PO CAPS: 1.0000 | ORAL_CAPSULE | Freq: Two times a day (BID) | ORAL | 5 refills | Status: DC

## 2017-01-16 NOTE — Progress Notes (Signed)
   Subjective:    Patient ID: Justin [name], male    DOB: 1970/10/24, 46 y.o.   MRN: [id number]  HPICheck up on IBS.  Ongoing challenge with ibs, needs to take meds  Ongoing stress, substantial in nature  Med still helping for the ibs,  Just got meds for the ibs, needs to go to the bathroom suddenly at times with substantial cramping   Coughing last night really bad, and passed out and hit head. Occurred suddenly. Had bad coughing spell, struck head on the floor, then had shaking like spell with it short period of time, drank some water chilled out, did not feel partic lightheaded  No blurry vision or headache today   Currently feeling better. No lightheadedness. No chest pain   Review of Systems No headache, no major weight loss or weight gain, no chest pain no back pain abdominal pain no change in bowel habits complete ROS otherwise negative     Objective:   Physical Exam Alert and oriented, vitals reviewed and stable, NAD ENT-TM's and ext canals WNL bilat via otoscopic exam Soft palate, tonsils and post pharynx WNL via oropharyngeal exam Neck-symmetric, no masses; thyroid nonpalpable and nontender Pulmonary-no tachypnea or accessory muscle use; Clear without wheezes via auscultation Card--no abnrml murmurs, rhythm reg and rate WNL Carotid pulses symmetric, without bruits Neurological exam intact no focal deficits       Assessment & Plan:  Impression post tussive syncope with loss of consciousness and subsequent contusion. Discussed at great length. Patient admitted to smoking marijuana right before the very bad coughing spell. Overall doing fine today. I do not think this warrants a major workup at this time. Rationale discussed. #2 IBS clinically stable discussed maintain same meds plan warning signs discussed. Avoid heavy coughing.

## 2017-01-16 NOTE — Patient Instructions (Signed)
"  post tussive syncope"  Vasovagal seizure like activity

## 2017-01-22 ENCOUNTER — Telehealth: Payer: Self-pay | Admitting: Family Medicine

## 2017-01-22 NOTE — Telephone Encounter (Signed)
Patient was seen on 4/20 and needing a letter on letterhead to put in his file about his IBS when he has to take a time away form his job to go to the restroom multiple times and sometimes long period of time due to his condition.

## 2017-01-30 ENCOUNTER — Telehealth: Payer: Self-pay | Admitting: Family Medicine

## 2017-01-30 NOTE — Telephone Encounter (Signed)
Tell him will be ready Monday he can pick up or we can mail

## 2017-01-30 NOTE — Telephone Encounter (Signed)
Patient calling checking on letter he requested back on 4/26. Please check message. He is needing it ASAP for his job.

## 2017-02-17 ENCOUNTER — Other Ambulatory Visit: Payer: Self-pay | Admitting: Family Medicine

## 2017-02-18 NOTE — Telephone Encounter (Signed)
Ok six mo worth 

## 2017-07-17 ENCOUNTER — Ambulatory Visit (INDEPENDENT_AMBULATORY_CARE_PROVIDER_SITE_OTHER): Payer: Federal, State, Local not specified - PPO | Admitting: Family Medicine

## 2017-07-17 ENCOUNTER — Encounter: Payer: Self-pay | Admitting: Family Medicine

## 2017-07-17 VITALS — BP 130/86 | Ht 68.0 in | Wt 219.6 lb

## 2017-07-17 DIAGNOSIS — K582 Mixed irritable bowel syndrome: Secondary | ICD-10-CM | POA: Diagnosis not present

## 2017-07-17 MED ORDER — CILIDINIUM-CHLORDIAZEPOXIDE 2.5-5 MG PO CAPS: 1.0000 | ORAL_CAPSULE | Freq: Two times a day (BID) | ORAL | 1 refills | Status: DC

## 2017-07-17 MED ORDER — TRIAMCINOLONE ACETONIDE 0.1 % EX CREA: TOPICAL_CREAM | CUTANEOUS | 2 refills | Status: DC

## 2017-07-17 NOTE — Progress Notes (Signed)
   Subjective:    Patient ID: Justin [name], male    DOB: 1970/12/01, 46 y.o.   MRN: [id number]  HPI Patient arrives for a follow up on his IBS and also needs a refill of his cream for his face.  IBS more flared up lately  Using the med fairly reg,  Trying to exercise and trying to eat right  exer three to four times per wk  States the medicine definitely helps. No substantial side effects  Review of Systems No headache, no major weight loss or weight gain, no chest pain no back pain abdominal pain no change in bowel habits complete ROS otherwise negative     Objective:   Physical Exam  Alert vitals stable, NAD. Blood pressure good on repeat. HEENT normal. Lungs clear. Heart regular rate and rhythm.       Assessment & Plan:  Impression irritable bowel syndrome. Chronic in nature. With ongoing need for work limitations also medications refilled. Diet exercisediscussed. Follow-up in 6 patient will likely have a FMLAform,

## 2017-08-18 DIAGNOSIS — K047 Periapical abscess without sinus: Secondary | ICD-10-CM | POA: Diagnosis not present

## 2018-01-01 DIAGNOSIS — K080 Exfoliation of teeth due to systemic causes: Secondary | ICD-10-CM | POA: Diagnosis not present

## 2018-01-02 DIAGNOSIS — K080 Exfoliation of teeth due to systemic causes: Secondary | ICD-10-CM | POA: Diagnosis not present

## 2018-01-08 ENCOUNTER — Ambulatory Visit: Payer: Federal, State, Local not specified - PPO | Admitting: Family Medicine

## 2018-01-08 ENCOUNTER — Encounter: Payer: Self-pay | Admitting: Family Medicine

## 2018-01-08 VITALS — BP 124/84 | Ht 68.0 in | Wt 212.0 lb

## 2018-01-08 DIAGNOSIS — Z79899 Other long term (current) drug therapy: Secondary | ICD-10-CM

## 2018-01-08 DIAGNOSIS — Z13220 Encounter for screening for lipoid disorders: Secondary | ICD-10-CM | POA: Diagnosis not present

## 2018-01-08 DIAGNOSIS — Z0001 Encounter for general adult medical examination with abnormal findings: Secondary | ICD-10-CM

## 2018-01-08 DIAGNOSIS — Z0000 Encounter for general adult medical examination without abnormal findings: Secondary | ICD-10-CM

## 2018-01-08 DIAGNOSIS — K582 Mixed irritable bowel syndrome: Secondary | ICD-10-CM | POA: Diagnosis not present

## 2018-01-08 DIAGNOSIS — Z125 Encounter for screening for malignant neoplasm of prostate: Secondary | ICD-10-CM | POA: Diagnosis not present

## 2018-01-08 DIAGNOSIS — M7661 Achilles tendinitis, right leg: Secondary | ICD-10-CM

## 2018-01-08 MED ORDER — CILIDINIUM-CHLORDIAZEPOXIDE 2.5-5 MG PO CAPS: 1.0000 | ORAL_CAPSULE | Freq: Two times a day (BID) | ORAL | 1 refills | Status: DC

## 2018-01-08 NOTE — Progress Notes (Signed)
   Subjective:    Patient ID: Justin [name], male    DOB: 1971-09-07, 47 y.o.   MRN: [id number]  HPI The patient comes in today for a wellness visit.    A review of their health history was completed.  A review of medications was also completed.  Any needed refills; Librax   Eating habits: health conscious  Falls/  MVA accidents in past few months: none  Regular exercise: cardio and push ups 3 -4 times a week  Specialist pt sees on regular basis: none    Preventative health issues were discussed.   Additional concerns: right foot pain for about one year.   Right foot hurting now for about a yr, hurting for over a yr,  Felt a strain a couple yrs ago  Certain shoes can hurt   If wears too much gets sore and painful  Wears socks in the  Back side of the ankly  IBS ongoing challenges, experiencing some days out, with work, still late to work with bowel habit challenges  Current boss works with pt well on this   Feeling more down last couple weeks with ex getting back to married and his own sec marriage havilg falled apart, Also stress with caring for ailing uncle    Review of Systems  Constitutional: Negative for activity change, appetite change and fever.  HENT: Negative for congestion and rhinorrhea.   Eyes: Negative for discharge.  Respiratory: Negative for cough and wheezing.   Cardiovascular: Negative for chest pain.  Gastrointestinal: Negative for abdominal pain, blood in stool and vomiting.  Genitourinary: Negative for difficulty urinating and frequency.  Musculoskeletal: Negative for neck pain.  Skin: Negative for rash.  Allergic/Immunologic: Negative for environmental allergies and food allergies.  Neurological: Negative for weakness and headaches.  Psychiatric/Behavioral: Negative for agitation.  All other systems reviewed and are negative.      Objective:   Physical Exam  Constitutional: He appears well-developed and well-nourished.  HENT:    Head: Normocephalic and atraumatic.  Right Ear: External ear normal.  Left Ear: External ear normal.  Nose: Nose normal.  Mouth/Throat: Oropharynx is clear and moist.  Eyes: Right eye exhibits no discharge. Left eye exhibits no discharge. No scleral icterus.  Neck: Normal range of motion. Neck supple. No thyromegaly present.  Cardiovascular: Normal rate, regular rhythm and normal heart sounds.  No murmur heard. Pulmonary/Chest: Effort normal and breath sounds normal. No respiratory distress. He has no wheezes.  Abdominal: Soft. Bowel sounds are normal. He exhibits no distension and no mass. There is no tenderness.  Genitourinary: Prostate normal and penis normal.  Musculoskeletal: Normal range of motion. He exhibits no edema.  Mild right posterior distal Achilles tenderness to palpation  Lymphadenopathy:    He has no cervical adenopathy.  Neurological: He is alert. He exhibits normal muscle tone. Coordination normal.  Skin: Skin is warm and dry. No erythema.  Psychiatric: He has a normal mood and affect. His behavior is normal. Judgment normal.          Assessment & Plan:  Impression 1 wellness exam.  Diet discussed exercise discussed  2.  Irritable bowel syndrome.  Fairly severe.  Ongoing need for work restrictions.  Discussed.  Will maintain maintain medications  3.  Achilles tendinitis stretching exercises encouraged and discussed  4.  Mild transient depression.  Patient does not want medication which I think is reasonable should slowly improve warning signs discussed

## 2018-01-09 LAB — LIPID PANEL
Chol/HDL Ratio: 3.5 ratio (ref 0.0–5.0)
Cholesterol, Total: 184 mg/dL (ref 100–199)
HDL: 53 mg/dL (ref >39)
LDL Calculated: 102 mg/dL — ABNORMAL HIGH (ref 0–99)
Triglycerides: 147 mg/dL (ref 0–149)
VLDL Cholesterol Cal: 29 mg/dL (ref 5–40)

## 2018-01-09 LAB — BASIC METABOLIC PANEL
BUN/Creatinine Ratio: 10 (ref 9–20)
BUN: 12 mg/dL (ref 6–24)
CO2: 26 mmol/L (ref 20–29)
Calcium: 9.5 mg/dL (ref 8.7–10.2)
Chloride: 103 mmol/L (ref 96–106)
Creatinine, Ser: 1.22 mg/dL (ref 0.76–1.27)
GFR calc Af Amer: 82 mL/min/{1.73_m2} (ref >59)
GFR calc non Af Amer: 71 mL/min/{1.73_m2} (ref >59)
Glucose: 114 mg/dL — ABNORMAL HIGH (ref 65–99)
Potassium: 4.6 mmol/L (ref 3.5–5.2)
Sodium: 142 mmol/L (ref 134–144)

## 2018-01-09 LAB — HEPATIC FUNCTION PANEL
ALT: 30 IU/L (ref 0–44)
AST: 27 IU/L (ref 0–40)
Albumin: 4.4 g/dL (ref 3.5–5.5)
Alkaline Phosphatase: 127 IU/L — ABNORMAL HIGH (ref 39–117)
Bilirubin Total: 0.6 mg/dL (ref 0.0–1.2)
Bilirubin, Direct: 0.16 mg/dL (ref 0.00–0.40)
Total Protein: 7.6 g/dL (ref 6.0–8.5)

## 2018-01-09 LAB — PSA: Prostate Specific Ag, Serum: 0.7 ng/mL (ref 0.0–4.0)

## 2018-01-13 ENCOUNTER — Encounter: Payer: Self-pay | Admitting: Family Medicine

## 2018-01-29 DIAGNOSIS — K080 Exfoliation of teeth due to systemic causes: Secondary | ICD-10-CM | POA: Diagnosis not present

## 2018-02-19 DIAGNOSIS — K080 Exfoliation of teeth due to systemic causes: Secondary | ICD-10-CM | POA: Diagnosis not present

## 2018-03-05 DIAGNOSIS — K080 Exfoliation of teeth due to systemic causes: Secondary | ICD-10-CM | POA: Diagnosis not present

## 2018-05-27 DIAGNOSIS — Z029 Encounter for administrative examinations, unspecified: Secondary | ICD-10-CM

## 2018-05-28 ENCOUNTER — Encounter: Payer: Self-pay | Admitting: Family Medicine

## 2018-05-28 ENCOUNTER — Ambulatory Visit: Payer: Federal, State, Local not specified - PPO | Admitting: Family Medicine

## 2018-05-28 VITALS — BP 118/76 | Ht 68.0 in | Wt 217.0 lb

## 2018-05-28 DIAGNOSIS — K582 Mixed irritable bowel syndrome: Secondary | ICD-10-CM | POA: Diagnosis not present

## 2018-05-28 MED ORDER — CILIDINIUM-CHLORDIAZEPOXIDE 2.5-5 MG PO CAPS: 1.0000 | ORAL_CAPSULE | Freq: Two times a day (BID) | ORAL | 1 refills | Status: DC

## 2018-05-28 NOTE — Progress Notes (Signed)
   Subjective:    Patient ID: Justin [name], male    DOB: 11-May-1971, 47 y.o.   MRN: [id number]  HPIpt arrives to get FMLA updated on IBS.   Still exercising   75 push up per day  Walking a fair amnt  Work overall going decent  Pt runs and sty sactive   Stretches also   Abdomen overal stabl e, still ibs is a sig issue  At times flare of the ibs keeps pt from starting work full day, often hit s the strt of the day ,  Has a bowel movement fairly reg, oncer per day is    Review of Systems No headache, no major weight loss or weight gain, no chest pain no back pain abdominal pain no change in bowel habits complete ROS otherwise negative     Objective:   Physical Exam  Alert vitals stable, NAD. Blood pressure good on repeat. HEENT normal. Lungs clear. Heart regular rate and rhythm.       Assessment & Plan:  Impression irritable bowel syndrome.  Ongoing challenges.  Ongoing need for FMLA.  Medication helps some but not enough at times.  Needs to continue FMLA limitations discussed with patient follow-up in 6 months

## 2018-06-01 ENCOUNTER — Telehealth: Payer: Self-pay | Admitting: Family Medicine

## 2018-06-01 NOTE — Telephone Encounter (Signed)
Patient brought in FMLA to be updated for IBS.I fill in what I could,please review,date and sign in your yellow folder.

## 2018-07-09 ENCOUNTER — Ambulatory Visit: Payer: Federal, State, Local not specified - PPO | Admitting: Family Medicine

## 2018-07-09 DIAGNOSIS — S46812A Strain of other muscles, fascia and tendons at shoulder and upper arm level, left arm, initial encounter: Secondary | ICD-10-CM | POA: Diagnosis not present

## 2018-10-01 DIAGNOSIS — B999 Unspecified infectious disease: Secondary | ICD-10-CM | POA: Diagnosis not present

## 2018-10-08 DIAGNOSIS — K080 Exfoliation of teeth due to systemic causes: Secondary | ICD-10-CM | POA: Diagnosis not present

## 2018-10-22 ENCOUNTER — Ambulatory Visit: Payer: Federal, State, Local not specified - PPO | Admitting: Family Medicine

## 2018-11-05 ENCOUNTER — Encounter: Payer: Self-pay | Admitting: Family Medicine

## 2018-11-05 ENCOUNTER — Ambulatory Visit: Payer: Federal, State, Local not specified - PPO | Admitting: Family Medicine

## 2018-11-05 VITALS — BP 122/84 | Ht 68.0 in | Wt 211.2 lb

## 2018-11-05 DIAGNOSIS — K582 Mixed irritable bowel syndrome: Secondary | ICD-10-CM

## 2018-11-05 MED ORDER — CLINDAMYCIN HCL 150 MG PO CAPS
150.0000 mg | ORAL_CAPSULE | Freq: Three times a day (TID) | ORAL | 0 refills | Status: DC
Start: 2018-11-05 — End: 2019-12-02

## 2018-11-05 MED ORDER — CILIDINIUM-CHLORDIAZEPOXIDE 2.5-5 MG PO CAPS: 1.0000 | ORAL_CAPSULE | Freq: Two times a day (BID) | ORAL | 1 refills | Status: DC

## 2018-11-05 NOTE — Patient Instructions (Signed)
Consider adding over the counter probiotics while taking this stronger antibiotic

## 2018-11-05 NOTE — Progress Notes (Signed)
   Subjective:    Patient ID: Justin [name], male    DOB: 1971/09/19, 48 y.o.   MRN: [id number]  HPI  Patient arrives for a follow up on IBS. Patient states he has an infection in is mouth from his bridge falling out and cant see dentist for a month.   Pt normallly has bridge, fell out, seemed to get infected, swllen and tender at times  Diet overall is better     Pt having challenges at hone     chronic ibs/ongoing well controlled with current medications   Review of Systems No headache, no major weight loss or weight gain, no chest pain no back pain abdominal pain no change in bowel habits complete ROS otherwise negative     Objective:   Physical Exam   Alert vitals stable, NAD. Blood pressure good on repeat. HEENT normal. Lungs clear. Heart regular rate and rhythm. Upper gum inflamed tender at the base of a bridge anterior Abdominal exam clinically stable    Assessment & Plan:  Impression 1 chronic IBS discussed ongoing medications refilled diet discussed  2.  Gingivitis/gum infection antibiotics prescribed.  Clindamycin is worked well in the past.  Encouraged to add probiotic follow-up in 6 months wellness plus chronic

## 2018-12-10 DIAGNOSIS — K080 Exfoliation of teeth due to systemic causes: Secondary | ICD-10-CM | POA: Diagnosis not present

## 2019-05-05 ENCOUNTER — Other Ambulatory Visit: Payer: Self-pay

## 2019-05-06 ENCOUNTER — Ambulatory Visit (INDEPENDENT_AMBULATORY_CARE_PROVIDER_SITE_OTHER): Payer: Federal, State, Local not specified - PPO | Admitting: Family Medicine

## 2019-05-06 ENCOUNTER — Encounter: Payer: Self-pay | Admitting: Family Medicine

## 2019-05-06 ENCOUNTER — Other Ambulatory Visit: Payer: Self-pay

## 2019-05-06 VITALS — BP 138/90 | Temp 97.2°F | Ht 68.0 in | Wt 211.8 lb

## 2019-05-06 DIAGNOSIS — Z13220 Encounter for screening for lipoid disorders: Secondary | ICD-10-CM

## 2019-05-06 DIAGNOSIS — K582 Mixed irritable bowel syndrome: Secondary | ICD-10-CM

## 2019-05-06 DIAGNOSIS — M79671 Pain in right foot: Secondary | ICD-10-CM

## 2019-05-06 DIAGNOSIS — Z0000 Encounter for general adult medical examination without abnormal findings: Secondary | ICD-10-CM

## 2019-05-06 DIAGNOSIS — Z125 Encounter for screening for malignant neoplasm of prostate: Secondary | ICD-10-CM

## 2019-05-06 MED ORDER — TRIAMCINOLONE ACETONIDE 0.1 % EX CREA: TOPICAL_CREAM | CUTANEOUS | 3 refills | Status: DC

## 2019-05-06 NOTE — Progress Notes (Signed)
Subjective:    Patient ID: Justin [name], male    DOB: Jul 02, 1971, 48 y.o.   MRN: [id number]  HPI The patient comes in today for a wellness visit.    A review of their health history was completed.  A review of medications was also completed.  Any needed refills; Triamcinolone cream  Eating habits: eating better; eating more vegetables and eating more spinach salads with olive oil dressings, eating less red meats   Falls/  MVA accidents in past few months: none  Regular exercise: 2-3 times a week   Specialist pt sees on regular basis: none  Preventative health issues were discussed.   Additional concerns: pain in heel still the same  Results for orders placed or performed in visit on 01/08/18  Lipid panel  Result Value Ref Range   Cholesterol, Total 184 100 - 199 mg/dL   Triglycerides 147 0 - 149 mg/dL   HDL 53 >39 mg/dL   VLDL Cholesterol Cal 29 5 - 40 mg/dL   LDL Calculated 102 (H) 0 - 99 mg/dL   Chol/HDL Ratio 3.5 0.0 - 5.0 ratio  Hepatic function panel  Result Value Ref Range   Total Protein 7.6 6.0 - 8.5 g/dL   Albumin 4.4 3.5 - 5.5 g/dL   Bilirubin Total 0.6 0.0 - 1.2 mg/dL   Bilirubin, Direct 0.16 0.00 - 0.40 mg/dL   Alkaline Phosphatase 127 (H) 39 - 117 IU/L   AST 27 0 - 40 IU/L   ALT 30 0 - 44 IU/L  Basic metabolic panel  Result Value Ref Range   Glucose 114 (H) 65 - 99 mg/dL   BUN 12 6 - 24 mg/dL   Creatinine, Ser 1.22 0.76 - 1.27 mg/dL   GFR calc non Af Amer 71 >59 mL/min/1.73   GFR calc Af Amer 82 >59 mL/min/1.73   BUN/Creatinine Ratio 10 9 - 20   Sodium 142 134 - 144 mmol/L   Potassium 4.6 3.5 - 5.2 mmol/L   Chloride 103 96 - 106 mmol/L   CO2 26 20 - 29 mmol/L   Calcium 9.5 8.7 - 10.2 mg/dL  PSA  Result Value Ref Range   Prostate Specific Ag, Serum 0.7 0.0 - 4.0 ng/mL     execising a afar amnt   Diet overall good eating beter ut back on the red meating doing more fish   ibs an ongoing concen, uses med and it helps.  No major side  effects from medications  Heel pain ongoing.  Pain primarily right heel.  Worse with standing.  Has been going on for quite some time.  For 2 years now.  Affects his ability to exercise.   More healthy      Review of Systems  Constitutional: Negative for activity change, appetite change and fever.  HENT: Negative for congestion and rhinorrhea.   Eyes: Negative for discharge.  Respiratory: Negative for cough and wheezing.   Cardiovascular: Negative for chest pain.  Gastrointestinal: Negative for abdominal pain, blood in stool and vomiting.  Genitourinary: Negative for difficulty urinating and frequency.  Musculoskeletal: Negative for neck pain.  Skin: Negative for rash.  Allergic/Immunologic: Negative for environmental allergies and food allergies.  Neurological: Negative for weakness and headaches.  Psychiatric/Behavioral: Negative for agitation.  All other systems reviewed and are negative.      Objective:   Physical Exam Vitals signs reviewed.  Constitutional:      Appearance: He is well-developed.  HENT:     Head: Normocephalic and  atraumatic.     Right Ear: External ear normal.     Left Ear: External ear normal.     Nose: Nose normal.  Eyes:     Pupils: Pupils are equal, round, and reactive to light.  Neck:     Musculoskeletal: Normal range of motion and neck supple.     Thyroid: No thyromegaly.  Cardiovascular:     Rate and Rhythm: Normal rate and regular rhythm.     Heart sounds: Normal heart sounds. No murmur.  Pulmonary:     Effort: Pulmonary effort is normal. No respiratory distress.     Breath sounds: Normal breath sounds. No wheezing.  Abdominal:     General: Bowel sounds are normal. There is no distension.     Palpations: Abdomen is soft. There is no mass.     Tenderness: There is no abdominal tenderness.  Genitourinary:    Penis: Normal.   Musculoskeletal: Normal range of motion.  Lymphadenopathy:     Cervical: No cervical adenopathy.  Skin:     General: Skin is warm and dry.     Findings: No erythema.  Neurological:     Mental Status: He is alert.     Motor: No abnormal muscle tone.  Psychiatric:        Behavior: Behavior normal.        Judgment: Judgment normal.     Right foot pulses excellent sensation intact distinct heel pain right lateral heel      Assessment & Plan:  Impression 1 wellness exam.  Diet discussed.  Exercise discussed vaccines discussed and encouraged  2.  Irritable bowel syndrome.  Ongoing with ongoing need for meds discussed  3.  Chronic heel pain.  Encouraged to see podiatrist patient agrees we will work on this

## 2019-05-09 ENCOUNTER — Encounter: Payer: Self-pay | Admitting: Family Medicine

## 2019-05-27 DIAGNOSIS — Z125 Encounter for screening for malignant neoplasm of prostate: Secondary | ICD-10-CM | POA: Diagnosis not present

## 2019-05-27 DIAGNOSIS — Z13220 Encounter for screening for lipoid disorders: Secondary | ICD-10-CM | POA: Diagnosis not present

## 2019-05-27 DIAGNOSIS — Z0000 Encounter for general adult medical examination without abnormal findings: Secondary | ICD-10-CM | POA: Diagnosis not present

## 2019-05-28 LAB — LIPID PANEL
Chol/HDL Ratio: 3.7 ratio (ref 0.0–5.0)
Cholesterol, Total: 204 mg/dL — ABNORMAL HIGH (ref 100–199)
HDL: 55 mg/dL (ref >39)
LDL Calculated: 129 mg/dL — ABNORMAL HIGH (ref 0–99)
Triglycerides: 99 mg/dL (ref 0–149)
VLDL Cholesterol Cal: 20 mg/dL (ref 5–40)

## 2019-05-28 LAB — HEPATIC FUNCTION PANEL
ALT: 25 IU/L (ref 0–44)
AST: 20 IU/L (ref 0–40)
Albumin: 4.4 g/dL (ref 4.0–5.0)
Alkaline Phosphatase: 125 IU/L — ABNORMAL HIGH (ref 39–117)
Bilirubin Total: 0.4 mg/dL (ref 0.0–1.2)
Bilirubin, Direct: 0.11 mg/dL (ref 0.00–0.40)
Total Protein: 7.6 g/dL (ref 6.0–8.5)

## 2019-05-28 LAB — BASIC METABOLIC PANEL
BUN/Creatinine Ratio: 18 (ref 9–20)
BUN: 18 mg/dL (ref 6–24)
CO2: 21 mmol/L (ref 20–29)
Calcium: 9.5 mg/dL (ref 8.7–10.2)
Chloride: 103 mmol/L (ref 96–106)
Creatinine, Ser: 1 mg/dL (ref 0.76–1.27)
GFR calc Af Amer: 103 mL/min/{1.73_m2} (ref >59)
GFR calc non Af Amer: 89 mL/min/{1.73_m2} (ref >59)
Glucose: 124 mg/dL — ABNORMAL HIGH (ref 65–99)
Potassium: 4.1 mmol/L (ref 3.5–5.2)
Sodium: 139 mmol/L (ref 134–144)

## 2019-05-28 LAB — PSA: Prostate Specific Ag, Serum: 0.6 ng/mL (ref 0.0–4.0)

## 2019-05-29 ENCOUNTER — Encounter: Payer: Self-pay | Admitting: Family Medicine

## 2019-06-03 ENCOUNTER — Ambulatory Visit: Payer: Federal, State, Local not specified - PPO | Admitting: Podiatry

## 2019-06-03 ENCOUNTER — Encounter: Payer: Self-pay | Admitting: Podiatry

## 2019-06-03 ENCOUNTER — Ambulatory Visit (INDEPENDENT_AMBULATORY_CARE_PROVIDER_SITE_OTHER): Payer: Federal, State, Local not specified - PPO

## 2019-06-03 ENCOUNTER — Other Ambulatory Visit: Payer: Self-pay

## 2019-06-03 ENCOUNTER — Other Ambulatory Visit: Payer: Self-pay | Admitting: Podiatry

## 2019-06-03 VITALS — BP 143/103

## 2019-06-03 DIAGNOSIS — M722 Plantar fascial fibromatosis: Secondary | ICD-10-CM | POA: Diagnosis not present

## 2019-06-03 DIAGNOSIS — M7661 Achilles tendinitis, right leg: Secondary | ICD-10-CM

## 2019-06-03 DIAGNOSIS — M79671 Pain in right foot: Secondary | ICD-10-CM

## 2019-06-03 MED ORDER — DICLOFENAC SODIUM 75 MG PO TBEC: 75.0000 mg | DELAYED_RELEASE_TABLET | Freq: Two times a day (BID) | ORAL | 2 refills | Status: DC

## 2019-06-03 NOTE — Patient Instructions (Signed)

## 2019-06-09 NOTE — Progress Notes (Signed)
Subjective:   Patient ID: Justin [name], male   DOB: 48 y.o.   MRN: [id number]   HPI Patient states his heel has been hurting him for a couple years when he accidentally twisted the wrong way.  States it hurts on the bottom and back of the right heel to moderate nature and patient does smoke occasional and would like to be more active but difficult because of the pain he experiences   Review of Systems  All other systems reviewed and are negative.       Objective:  Physical Exam Vitals signs and nursing note reviewed.  Constitutional:      Appearance: He is well-developed.  Pulmonary:     Effort: Pulmonary effort is normal.  Musculoskeletal: Normal range of motion.  Skin:    General: Skin is warm.  Neurological:     Mental Status: He is alert.     Neurovascular status intact muscle strength found to be adequate range of motion within normal limits with equinus condition noted bilateral.  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  The pain is not severe currently and will get a try to utilize orthotics to reduce all stress on the plantar posterior heel region along with heel lift therapy and I gave him instructions on physical therapy and stretching exercises.  Also placed him on diclofenac 75 mg twice daily and we will reevaluate this patient in the next several weeks  X-rays indicate moderate depression of the arch with minimal posterior plantar spurring noted

## 2019-07-21 ENCOUNTER — Other Ambulatory Visit: Payer: Self-pay

## 2019-07-21 ENCOUNTER — Ambulatory Visit (INDEPENDENT_AMBULATORY_CARE_PROVIDER_SITE_OTHER): Payer: Federal, State, Local not specified - PPO | Admitting: Orthotics

## 2019-07-21 DIAGNOSIS — M7661 Achilles tendinitis, right leg: Secondary | ICD-10-CM

## 2019-07-21 DIAGNOSIS — M722 Plantar fascial fibromatosis: Secondary | ICD-10-CM

## 2019-07-21 DIAGNOSIS — M79671 Pain in right foot: Secondary | ICD-10-CM

## 2019-07-21 NOTE — Progress Notes (Signed)
Patient came in today to pick up custom made foot orthotics.  The goals were accomplished and the patient reported no dissatisfaction with said orthotics.  Patient was advised of breakin period and how to report any issues. 

## 2019-09-14 ENCOUNTER — Other Ambulatory Visit: Payer: Self-pay

## 2019-09-14 ENCOUNTER — Telehealth: Payer: Self-pay | Admitting: Family Medicine

## 2019-09-14 MED ORDER — CILIDINIUM-CHLORDIAZEPOXIDE 2.5-5 MG PO CAPS: 1.0000 | ORAL_CAPSULE | Freq: Two times a day (BID) | ORAL | 1 refills | Status: DC

## 2019-09-14 NOTE — Telephone Encounter (Signed)
Script printed; awaiting signature. Will send to [facility] [address] once signed. Contacted [facility] on [address] and they do not have medication in stock. Pt contacted and has been made aware; pt is ok with med being sent to [facility]

## 2019-09-14 NOTE — Telephone Encounter (Signed)
Pt last seen 05/06/2019 for wellness

## 2019-09-14 NOTE — Telephone Encounter (Signed)
Sure six mo worth 

## 2019-09-14 NOTE — Telephone Encounter (Signed)
clidinium-chlordiaze POXIDE (LIBRAX) 5-2.5 MG capsule for IBS  [facility] said [facility] a 50 mile radius of his home and wants to know if we can call it in to [facility] instead. (I told him to call [facility] and check to see if they have it and if not call us back)  [facility]- [address], [place]

## 2019-11-02 ENCOUNTER — Encounter: Payer: Self-pay | Admitting: Family Medicine

## 2019-11-06 ENCOUNTER — Other Ambulatory Visit: Payer: Self-pay | Admitting: Family Medicine

## 2019-11-08 NOTE — Telephone Encounter (Signed)
Dr. Mariane Duval patient

## 2019-12-02 ENCOUNTER — Other Ambulatory Visit: Payer: Self-pay

## 2019-12-02 ENCOUNTER — Encounter: Payer: Self-pay | Admitting: Family Medicine

## 2019-12-02 ENCOUNTER — Ambulatory Visit: Payer: Federal, State, Local not specified - PPO | Admitting: Family Medicine

## 2019-12-02 VITALS — BP 122/78 | Temp 96.5°F | Wt 218.4 lb

## 2019-12-02 DIAGNOSIS — K582 Mixed irritable bowel syndrome: Secondary | ICD-10-CM | POA: Diagnosis not present

## 2019-12-02 NOTE — Progress Notes (Signed)
t

## 2019-12-02 NOTE — Progress Notes (Signed)
   Subjective:    Patient ID: Justin [name], male    DOB: 1971-07-15, 49 y.o.   MRN: [id number]  HPI Pt here for IBS follow up. Pt states his IBS has been worse here lately due to stress. Pt was having a hard time getting med due to pharmaceutical companies not having med, but that has been straightened out.   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 Blood pressure good on repeat. HEENT normal. Lungs clear. Heart regular rate and rhythm. Abdominal exam benign      Assessment & Plan:  Impression chronic IBS.  Discussed.  With ongoing need for modifications with work.  Ongoing need for medications.  This medicine was temporarily out during that time he had substantial flare of symptoms.  Now medicine is available again and patient states overall feeling better.  Follow-up in 6 months.  Diet exercise discussed

## 2019-12-07 DIAGNOSIS — Z029 Encounter for administrative examinations, unspecified: Secondary | ICD-10-CM

## 2019-12-08 ENCOUNTER — Telehealth: Payer: Self-pay | Admitting: Family Medicine

## 2019-12-08 NOTE — Telephone Encounter (Signed)
Please advise. Thank you

## 2019-12-08 NOTE — Telephone Encounter (Signed)
Patient brought in FMLA to be updated for his IBS. Form is in your folder to be reviewed date and sign in your yellow folder.

## 2020-08-31 ENCOUNTER — Ambulatory Visit (INDEPENDENT_AMBULATORY_CARE_PROVIDER_SITE_OTHER): Payer: Federal, State, Local not specified - PPO | Admitting: Family Medicine

## 2020-08-31 ENCOUNTER — Encounter: Payer: Self-pay | Admitting: Family Medicine

## 2020-08-31 VITALS — BP 126/82 | HR 100 | Temp 97.8°F | Wt 227.8 lb

## 2020-08-31 DIAGNOSIS — K080 Exfoliation of teeth due to systemic causes: Secondary | ICD-10-CM | POA: Diagnosis not present

## 2020-08-31 DIAGNOSIS — K582 Mixed irritable bowel syndrome: Secondary | ICD-10-CM

## 2020-08-31 DIAGNOSIS — L309 Dermatitis, unspecified: Secondary | ICD-10-CM

## 2020-08-31 MED ORDER — TRIAMCINOLONE ACETONIDE 0.1 % EX CREA: TOPICAL_CREAM | CUTANEOUS | 3 refills | Status: DC

## 2020-08-31 MED ORDER — CILIDINIUM-CHLORDIAZEPOXIDE 2.5-5 MG PO CAPS: 1.0000 | ORAL_CAPSULE | Freq: Two times a day (BID) | ORAL | 5 refills | Status: DC

## 2020-08-31 NOTE — Progress Notes (Signed)
Patient ID: Justin [name], male    DOB: [birth year], 49 y.o.   MRN: [id number]   Chief Complaint  Patient presents with  . Irritable Bowel Syndrome    Patient reports diet is under good control. Amount of exercise had decreased but in the last 2 weeks has started getting back into a routine.   . Eczema    Patient requesting refill on Triamcinolone cream.   Subjective:    HPI  F/u-  h/o IBS has diarrhea/constipation.  Stress related at times and having worse cramping, not as much with diet.  If not having enough veggies it will have constipation. Oils and mayonnaise it can flare it up. Is taking librax for IBS.   Or if drinking cold water can cause his IBS. Does a lot of fasting in January.  Drinking water and trying to keep eating same fods. Just worked with last pcp and came up with Librax to help with his "stress" and IBS. Hard to drink very cold water. Dealing with stress with uncle with copd and bone cancer.  Trying to stay safe and avoid covid. Working for [employer].  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  occ a few days call out a few hours.  -number of call outs- July - 8x, aug-5x, sept, 3x, then oct- 4x, and nov- 4x. Some times calling out a whole day or just a few hours. 3-4 x per week and 3-4 x per month 1 day per episode.   Had this in [place] at 49 yrs old. Had colonosocopy in 2014.  GI -seen and dx with IBS.  Normally taking librax daily.  With getting up in am. Not having bloody or black stools.  occ can get a rectal fissure.  hyoscamine -in past. Not as helpful due to side effects. More lightheaded in past, drowsiness.  Medical History Justin [name] has a past medical history of Anemia and IBS (irritable bowel syndrome).   Outpatient Encounter Medications as of 08/31/2020  Medication Sig  . clidinium-chlordiazePOXIDE (LIBRAX) 5-2.5 MG capsule Take 1 capsule by mouth 2 (two) times daily.  Marland Kitchen ibuprofen (ADVIL) 800 MG  tablet   . triamcinolone (KENALOG) 0.1 % APPLY TO AFFECTED AREA TWICE A DAY  . [DISCONTINUED] clidinium-chlordiazePOXIDE (LIBRAX) 5-2.5 MG capsule TAKE 1 CAPSULE BY MOUTH TWICE A DAY  . [DISCONTINUED] triamcinolone cream (KENALOG) 0.1 % APPLY TO AFFECTED AREA TWICE A DAY   No facility-administered encounter medications on file as of 08/31/2020.     Review of Systems  Constitutional: Negative for chills and fever.  HENT: Negative for congestion, rhinorrhea and sore throat.   Respiratory: Negative for cough, shortness of breath and wheezing.   Cardiovascular: Negative for chest pain and leg swelling.  Gastrointestinal: Positive for abdominal pain. Negative for diarrhea, nausea and vomiting. Anal bleeding: cramping with IBS, controlled with meds.  Genitourinary: Negative for dysuria and frequency.  Skin: Negative for rash.  Neurological: Negative for dizziness, weakness and headaches.     Vitals BP 126/82   Pulse 100   Temp 97.8 F (36.6 C)   Wt 227 lb 12.8 oz (103.3 kg)   SpO2 100%   BMI 34.64 kg/m   Objective:   Physical Exam Vitals and nursing note reviewed.  Constitutional:      General: He is not in acute distress.    Appearance: Normal appearance. He is not ill-appearing.  HENT:     Head: Normocephalic.  Eyes:  Extraocular Movements: Extraocular movements intact.     Conjunctiva/sclera: Conjunctivae normal.     Pupils: Pupils are equal, round, and reactive to light.  Cardiovascular:     Rate and Rhythm: Normal rate and regular rhythm.     Pulses: Normal pulses.     Heart sounds: Normal heart sounds. No murmur heard.   Pulmonary:     Effort: Pulmonary effort is normal.     Breath sounds: Normal breath sounds. No wheezing, rhonchi or rales.  Abdominal:     General: Abdomen is flat. Bowel sounds are normal. There is no distension.     Palpations: Abdomen is soft. There is no mass.     Tenderness: There is no abdominal tenderness. There is no guarding or  rebound.     Hernia: No hernia is present.  Musculoskeletal:        General: Normal range of motion.     Right lower leg: No edema.     Left lower leg: No edema.  Skin:    General: Skin is warm and dry.     Findings: No rash.  Neurological:     General: No focal deficit present.     Mental Status: He is alert and oriented to person, place, and time.     Cranial Nerves: No cranial nerve deficit.  Psychiatric:        Mood and Affect: Mood normal.        Behavior: Behavior normal.        Thought Content: Thought content normal.        Judgment: Judgment normal.     Assessment and Plan   1. Irritable bowel syndrome with both constipation and diarrhea - clidinium-chlordiazePOXIDE (LIBRAX) 5-2.5 MG capsule; Take 1 capsule by mouth 2 (two) times daily.  Dispense: 60 capsule; Refill: 5  2. Eczema, unspecified type - triamcinolone (KENALOG) 0.1 %; APPLY TO AFFECTED AREA TWICE A DAY  Dispense: 30 g; Refill: 3   Discussed the medication treatment for IBS, and that it is not the usual treatment for IBS.  That this medication is usually for anxiety.  But years ago was used for IBS. Pt stating has seen specialist in past for this.  Tried many medications, but this seems to help the most.  Pt will start fill 11/13/20.  Since just picked up 90 day supply earlier this month. Pt has tried a few medications over the years for IBS.  This seems to work best per pt, will cont with this.  No side effects.  Discussed long term use could cause memory issues or dementia or falls.  Pt voiced understanding.  F/u 87mo or prn.

## 2020-11-02 ENCOUNTER — Ambulatory Visit (INDEPENDENT_AMBULATORY_CARE_PROVIDER_SITE_OTHER): Payer: Federal, State, Local not specified - PPO | Admitting: Family Medicine

## 2020-11-02 ENCOUNTER — Encounter: Payer: Self-pay | Admitting: Family Medicine

## 2020-11-02 ENCOUNTER — Other Ambulatory Visit: Payer: Self-pay

## 2020-11-02 VITALS — BP 124/80 | HR 104 | Temp 98.2°F | Ht 69.0 in | Wt 221.0 lb

## 2020-11-02 DIAGNOSIS — Z0000 Encounter for general adult medical examination without abnormal findings: Secondary | ICD-10-CM | POA: Diagnosis not present

## 2020-11-02 DIAGNOSIS — Z13220 Encounter for screening for lipoid disorders: Secondary | ICD-10-CM

## 2020-11-02 DIAGNOSIS — L309 Dermatitis, unspecified: Secondary | ICD-10-CM

## 2020-11-02 DIAGNOSIS — K582 Mixed irritable bowel syndrome: Secondary | ICD-10-CM

## 2020-11-02 DIAGNOSIS — R5383 Other fatigue: Secondary | ICD-10-CM

## 2020-11-02 DIAGNOSIS — Z125 Encounter for screening for malignant neoplasm of prostate: Secondary | ICD-10-CM

## 2020-11-02 MED ORDER — CILIDINIUM-CHLORDIAZEPOXIDE 2.5-5 MG PO CAPS: 1.0000 | ORAL_CAPSULE | Freq: Two times a day (BID) | ORAL | 5 refills | Status: DC

## 2020-11-02 MED ORDER — TRIAMCINOLONE ACETONIDE 0.1 % EX CREA: TOPICAL_CREAM | CUTANEOUS | 2 refills | Status: DC

## 2020-11-02 NOTE — Progress Notes (Signed)
Patient ID: Justin [name], male    DOB: [birth year], 50 y.o.   MRN: [id number]   No chief complaint on file.  Subjective:  CC: wellness exam  Presents today for wellness exam. No concerns voiced, chronic conditions include eczema and IBS. Routine refills requested. Reports he has abdominal pain and diarrhea associated with his IBS, this is not a new symptom. He will get labs today following this visit. Denies fever, chills, chest pain, shortness of breath. His last colonoscopy 2014, he has received 2 of his COVID vaccines, needs to schedule his booster, there is issue with his vaccine card, this has delayed getting the booster.    The patient comes in today for a wellness visit.    A review of their health history was completed.  A review of medications was also completed.  Any needed refills; librax and triamcinolone  Eating habits: health conscious  Falls/  MVA accidents in past few months: none  Regular exercise: squats, push ups, down stairs 3 times per day 3 -4 times per week   Specialist: none  Preventative health issues were discussed.   Additional concerns: pain in right heel.    Medical History Justin [name] has a past medical history of Anemia and IBS (irritable bowel syndrome).   Outpatient Encounter Medications as of 11/02/2020  Medication Sig  . [DISCONTINUED] clidinium-chlordiazePOXIDE (LIBRAX) 5-2.5 MG capsule Take 1 capsule by mouth 2 (two) times daily.  . [DISCONTINUED] ibuprofen (ADVIL) 800 MG tablet   . [DISCONTINUED] triamcinolone (KENALOG) 0.1 % APPLY TO AFFECTED AREA TWICE A DAY  . clidinium-chlordiazePOXIDE (LIBRAX) 5-2.5 MG capsule Take 1 capsule by mouth 2 (two) times daily.  Marland Kitchen triamcinolone (KENALOG) 0.1 % APPLY TO AFFECTED AREA TWICE A DAY  . [DISCONTINUED] clidinium-chlordiazePOXIDE (LIBRAX) 5-2.5 MG capsule Take 1 capsule by mouth 2 (two) times daily.  . [DISCONTINUED] clidinium-chlordiazePOXIDE (LIBRAX) 5-2.5 MG capsule Take 1 capsule by mouth 2  (two) times daily.   No facility-administered encounter medications on file as of 11/02/2020.     Review of Systems  Constitutional: Negative for chills, fatigue and fever.  HENT: Negative for congestion.   Respiratory: Negative for cough and shortness of breath.   Cardiovascular: Negative for chest pain, palpitations and leg swelling.  Gastrointestinal: Positive for abdominal pain and diarrhea. Negative for nausea and vomiting.       Has IBS- has pain every other day.  Some diarrhea, depends on diet.   Genitourinary: Negative for penile discharge, penile pain, penile swelling, scrotal swelling and testicular pain.  Skin: Negative for rash.  Neurological: Negative for weakness, numbness and headaches.  Psychiatric/Behavioral: Negative for sleep disturbance.     Vitals BP 124/80   Pulse (!) 104   Temp 98.2 F (36.8 C)   Ht 5' 9"  (1.753 m)   Wt 221 lb (100.2 kg)   SpO2 100%   BMI 32.64 kg/m   Objective:   Physical Exam Vitals reviewed.  HENT:     Right Ear: Tympanic membrane normal.     Left Ear: Tympanic membrane normal.     Nose: Nose normal.     Mouth/Throat:     Mouth: Mucous membranes are moist.     Pharynx: No posterior oropharyngeal erythema.  Eyes:     Extraocular Movements: Extraocular movements intact.     Pupils: Pupils are equal, round, and reactive to light.  Cardiovascular:     Rate and Rhythm: Normal rate and regular rhythm.     Heart sounds: Normal heart sounds.  Pulmonary:     Effort: Pulmonary effort is normal.     Breath sounds: Normal breath sounds.  Abdominal:     General: Bowel sounds are normal.     Palpations: Abdomen is soft.     Tenderness: There is no abdominal tenderness.  Musculoskeletal:        General: No swelling, tenderness or signs of injury. Normal range of motion.     Cervical back: Normal range of motion.     Right lower leg: No edema.     Left lower leg: No edema.  Skin:    General: Skin is warm and dry.  Neurological:      General: No focal deficit present.     Mental Status: He is alert.  Psychiatric:        Behavior: Behavior normal.      Assessment and Plan   1. Routine general medical examination at a health care facility - CBC with Differential - Comprehensive Metabolic Panel (CMET) - PSA - Lipid Profile  2. Screening for cholesterol level - Lipid Profile  3. Screening for prostate cancer - PSA  4. Fatigue, unspecified type - CBC with Differential  5. Irritable bowel syndrome with both constipation and diarrhea - CBC with Differential - Comprehensive Metabolic Panel (CMET) - PSA - Lipid Profile - clidinium-chlordiazePOXIDE (LIBRAX) 5-2.5 MG capsule; Take 1 capsule by mouth 2 (two) times daily.  Dispense: 60 capsule; Refill: 5  6. Eczema, unspecified type - triamcinolone (KENALOG) 0.1 %; APPLY TO AFFECTED AREA TWICE A DAY  Dispense: 30 g; Refill: 2   Concerns: no concerns  Chronic conditions:  IBS  Medication compliance: takes Librax daily,  Well-controlled (no chest pain, no shortness of breath, no leg swelling, blood sugars controlled): well-controlled with good diet and less stress.  Medication management interval: every 6 months Monitors blood pressure: no Monitors blood sugars: n/a Refills requested today: yes, Librax, Kenalog  Specialists: Podiatrist: needs more in soles   Health Maintenance:  Colonoscopy: done in 2014.  Prostate exam: shared decision making: declines DRE today, will do PSA.  Immunizations: Needs Covid booster, needs to schedule soon. Needs to print card.  ASCVD risk: 4.5% 10 year risk  Lab review: labs to be drawn today (last labs 05/27/19) LDL: 129 Triglycerides: 99 A1C: n/a Renal: normal Hepatic: Alk phos 125 TSH: n/a   Lifestyle:   Diet and exercise: cardio and body weight resistance Alcohol consumption: 2 ounces per week Smoking cessation: quit smoking May 2021 Sexually active: yes, one partner, no condom use, declines need for  STD screening  Functional assessment: independent with good functional status Vision: eye checked yearly- last time 2020. Over due- encouraged to schedule soon Dental: every 6 months.    Safety measures recommended: seat belt use while in vehicle, safe sexual practices, no illicit drug use, no excessive alcohol use. Diet and exercise/ lifestyle modifications discussed. Recommend 150 minutes per week of exercise such as walking. Recommend lots of fresh produce to include fruits, vegetables, beans, healthy fats such as avocado, nuts, seeds, and 3-6 ounces of protein at each meal.  Avoid fried foods, fast food, and processed carbohydrates. Limit alcohol consumption: no more than one drink per day for women and 2 drinks per day for men. Alcohol use is not appropriate with certain medications.  Stress management discussed. Questions answered.   Agrees with plan of care discussed today. Understands warning signs to seek further care: chest pain, shortness of breath, any significant change in health.  Understands to  follow-up in six months for routine medication management for IBS. Notify once lab results are available, he will get those today.        Pecolia Ades, NP 11/02/2020

## 2020-11-02 NOTE — Patient Instructions (Signed)

## 2020-11-03 LAB — PSA: Prostate Specific Ag, Serum: 0.5 ng/mL (ref 0.0–4.0)

## 2020-11-03 LAB — COMPREHENSIVE METABOLIC PANEL
ALT: 50 IU/L — ABNORMAL HIGH (ref 0–44)
AST: 24 IU/L (ref 0–40)
Albumin/Globulin Ratio: 1.1 — ABNORMAL LOW (ref 1.2–2.2)
Albumin: 4 g/dL (ref 4.0–5.0)
Alkaline Phosphatase: 140 IU/L — ABNORMAL HIGH (ref 44–121)
BUN/Creatinine Ratio: 9 (ref 9–20)
BUN: 11 mg/dL (ref 6–24)
Bilirubin Total: 0.4 mg/dL (ref 0.0–1.2)
CO2: 21 mmol/L (ref 20–29)
Calcium: 9.9 mg/dL (ref 8.7–10.2)
Chloride: 103 mmol/L (ref 96–106)
Creatinine, Ser: 1.25 mg/dL (ref 0.76–1.27)
GFR calc Af Amer: 78 mL/min/{1.73_m2} (ref >59)
GFR calc non Af Amer: 68 mL/min/{1.73_m2} (ref >59)
Globulin, Total: 3.8 g/dL (ref 1.5–4.5)
Glucose: 118 mg/dL — ABNORMAL HIGH (ref 65–99)
Potassium: 4.7 mmol/L (ref 3.5–5.2)
Sodium: 139 mmol/L (ref 134–144)
Total Protein: 7.8 g/dL (ref 6.0–8.5)

## 2020-11-03 LAB — CBC WITH DIFFERENTIAL/PLATELET
Basophils Absolute: 0 10*3/uL (ref 0.0–0.2)
Basos: 0 %
EOS (ABSOLUTE): 0.1 10*3/uL (ref 0.0–0.4)
Eos: 1 %
Hematocrit: 48.8 % (ref 37.5–51.0)
Hemoglobin: 16.4 g/dL (ref 13.0–17.7)
Immature Grans (Abs): 0 10*3/uL (ref 0.0–0.1)
Immature Granulocytes: 0 %
Lymphocytes Absolute: 2.1 10*3/uL (ref 0.7–3.1)
Lymphs: 41 %
MCH: 27.9 pg (ref 26.6–33.0)
MCHC: 33.6 g/dL (ref 31.5–35.7)
MCV: 83 fL (ref 79–97)
Monocytes Absolute: 0.5 10*3/uL (ref 0.1–0.9)
Monocytes: 9 %
Neutrophils Absolute: 2.5 10*3/uL (ref 1.4–7.0)
Neutrophils: 49 %
Platelets: 276 10*3/uL (ref 150–450)
RBC: 5.87 x10E6/uL — ABNORMAL HIGH (ref 4.14–5.80)
RDW: 14.2 % (ref 11.6–15.4)
WBC: 5.2 10*3/uL (ref 3.4–10.8)

## 2020-11-03 LAB — LIPID PANEL
Chol/HDL Ratio: 4.4 ratio (ref 0.0–5.0)
Cholesterol, Total: 192 mg/dL (ref 100–199)
HDL: 44 mg/dL (ref >39)
LDL Chol Calc (NIH): 131 mg/dL — ABNORMAL HIGH (ref 0–99)
Triglycerides: 93 mg/dL (ref 0–149)
VLDL Cholesterol Cal: 17 mg/dL (ref 5–40)

## 2020-11-04 ENCOUNTER — Encounter: Payer: Self-pay | Admitting: Family Medicine

## 2020-11-13 ENCOUNTER — Other Ambulatory Visit: Payer: Self-pay | Admitting: *Deleted

## 2020-11-13 DIAGNOSIS — R748 Abnormal levels of other serum enzymes: Secondary | ICD-10-CM

## 2020-12-06 ENCOUNTER — Encounter: Payer: Self-pay | Admitting: Podiatry

## 2020-12-06 ENCOUNTER — Other Ambulatory Visit: Payer: Self-pay

## 2020-12-06 ENCOUNTER — Ambulatory Visit: Payer: Federal, State, Local not specified - PPO | Admitting: Podiatry

## 2020-12-06 ENCOUNTER — Ambulatory Visit (INDEPENDENT_AMBULATORY_CARE_PROVIDER_SITE_OTHER): Payer: Federal, State, Local not specified - PPO | Admitting: Podiatry

## 2020-12-06 DIAGNOSIS — M7661 Achilles tendinitis, right leg: Secondary | ICD-10-CM | POA: Diagnosis not present

## 2020-12-06 DIAGNOSIS — M722 Plantar fascial fibromatosis: Secondary | ICD-10-CM

## 2020-12-06 NOTE — Progress Notes (Signed)
Patient was seen by Dr Paulla Dolly today and Dr Paulla Dolly filled out a prescription form for [facility] to get the same inserts as last time.

## 2020-12-06 NOTE — Progress Notes (Signed)
Subjective:   Patient ID: Justin [name], male   DOB: 50 y.o.   MRN: [id number]   HPI Patient states orthotics have been very beneficial and he wants a new pair discussed his foot structure   ROS      Objective:  Physical Exam  Neurovascular status intact with patient found to have moderate collapsed arches bilateral history of fasciitis-like symptoms with good recovery utilizing orthotics which is starting to wear out and are difficult for him to use like he wants      Assessment:  Doing well post treatment with orthotics with fascial symptomatology for the most part being kept under control      Plan:  H&P reviewed conditions and recommended orthotics and patient is going to have a new pair of orthotics made with the same parameters of the last pair.  Discussed shoe gear modifications and continued stretching exercises

## 2021-01-01 ENCOUNTER — Ambulatory Visit (INDEPENDENT_AMBULATORY_CARE_PROVIDER_SITE_OTHER): Payer: Federal, State, Local not specified - PPO | Admitting: Podiatry

## 2021-01-01 ENCOUNTER — Other Ambulatory Visit: Payer: Self-pay

## 2021-01-01 DIAGNOSIS — M7661 Achilles tendinitis, right leg: Secondary | ICD-10-CM

## 2021-01-01 DIAGNOSIS — M722 Plantar fascial fibromatosis: Secondary | ICD-10-CM

## 2021-01-01 NOTE — Progress Notes (Signed)
Patient presents today for orthotic pick up. Patient voices no new complaints.  Orthotics were fitted to patient's feet. No discomfort and no rubbing. Patient satisfied with the orthotics.  Orthotics were dispensed to patient with instructions for break in wear and to call the office with any concerns or questions.  Patient asked if we could re-furbish the old orthotics and I stated that we could and that it was $90.00 and that would have to be paid today and patient agreed and was paid today.

## 2021-01-01 NOTE — Patient Instructions (Signed)

## 2021-03-01 ENCOUNTER — Ambulatory Visit: Payer: Federal, State, Local not specified - PPO | Admitting: Family Medicine

## 2021-04-04 ENCOUNTER — Ambulatory Visit: Payer: Federal, State, Local not specified - PPO | Admitting: Family Medicine

## 2021-04-04 ENCOUNTER — Other Ambulatory Visit: Payer: Self-pay

## 2021-04-04 VITALS — BP 137/98 | HR 76 | Temp 98.1°F | Ht 69.0 in | Wt 223.8 lb

## 2021-04-04 DIAGNOSIS — Z13220 Encounter for screening for lipoid disorders: Secondary | ICD-10-CM | POA: Diagnosis not present

## 2021-04-04 DIAGNOSIS — R748 Abnormal levels of other serum enzymes: Secondary | ICD-10-CM | POA: Diagnosis not present

## 2021-04-04 DIAGNOSIS — R7301 Impaired fasting glucose: Secondary | ICD-10-CM | POA: Diagnosis not present

## 2021-04-04 DIAGNOSIS — K582 Mixed irritable bowel syndrome: Secondary | ICD-10-CM

## 2021-04-04 DIAGNOSIS — R030 Elevated blood-pressure reading, without diagnosis of hypertension: Secondary | ICD-10-CM

## 2021-04-04 MED ORDER — CILIDINIUM-CHLORDIAZEPOXIDE 2.5-5 MG PO CAPS: 1.0000 | ORAL_CAPSULE | Freq: Two times a day (BID) | ORAL | 5 refills | Status: DC

## 2021-04-04 NOTE — Progress Notes (Signed)
Patient ID: Justin [name], male    DOB: July 07, 1971, 50 y.o.   MRN: [id number]   Chief Complaint  Patient presents with   Irritable Bowel Syndrome    Cramping, abdominal pain, some constipation   Subjective:    HPI CC- f/u IBS Long standing chronic problem.  Controlled since being on librax. Pt doing well, but has uncle living with him and feeling inc in stress. Work stress and car break downs.  Has h/o IBS with cramping and abd pain. Taking librax for this.  Some times intermittent with IBS.  Using the librax bid. Has been on this for years. Tried other meds in past. Last pcp was giving this to him for years, since 2014. Pt stating this helped with is IBS and "stress." Pt stating he's not consistent with his diet and when getting more stress at work his stomach cramps more.   Medical History Justin [name] has a past medical history of Anemia and IBS (irritable bowel syndrome).   Outpatient Encounter Medications as of 04/04/2021  Medication Sig   triamcinolone (KENALOG) 0.1 % APPLY TO AFFECTED AREA TWICE A DAY   [DISCONTINUED] clidinium-chlordiazePOXIDE (LIBRAX) 5-2.5 MG capsule Take 1 capsule by mouth 2 (two) times daily.   clidinium-chlordiazePOXIDE (LIBRAX) 5-2.5 MG capsule Take 1 capsule by mouth 2 (two) times daily.   No facility-administered encounter medications on file as of 04/04/2021.     Review of Systems  Constitutional:  Negative for chills and fever.  HENT:  Negative for congestion, rhinorrhea and sore throat.   Respiratory:  Negative for cough, shortness of breath and wheezing.   Cardiovascular:  Negative for chest pain and leg swelling.  Gastrointestinal:  Positive for abdominal pain (intermittent). Negative for diarrhea, nausea and vomiting.  Genitourinary:  Negative for dysuria and frequency.  Skin:  Negative for rash.  Neurological:  Negative for dizziness, weakness and headaches.    Vitals BP (!) 137/98   Pulse 76   Temp 98.1 F (36.7 C)   Ht 5' 9"   (1.753 m)   Wt 223 lb 12.8 oz (101.5 kg)   SpO2 99%   BMI 33.05 kg/m   Objective:   Physical Exam Vitals and nursing note reviewed.  Constitutional:      General: He is not in acute distress.    Appearance: Normal appearance. He is not ill-appearing.  HENT:     Head: Normocephalic.     Nose: Nose normal. No congestion.     Mouth/Throat:     Mouth: Mucous membranes are moist.     Pharynx: No oropharyngeal exudate.  Eyes:     Extraocular Movements: Extraocular movements intact.     Conjunctiva/sclera: Conjunctivae normal.     Pupils: Pupils are equal, round, and reactive to light.  Cardiovascular:     Rate and Rhythm: Normal rate and regular rhythm.     Pulses: Normal pulses.     Heart sounds: Normal heart sounds. No murmur heard. Pulmonary:     Effort: Pulmonary effort is normal.     Breath sounds: Normal breath sounds. No wheezing, rhonchi or rales.  Musculoskeletal:        General: Normal range of motion.     Right lower leg: No edema.     Left lower leg: No edema.  Skin:    General: Skin is warm and dry.     Findings: No rash.  Neurological:     General: No focal deficit present.     Mental Status: He is alert  and oriented to person, place, and time.     Cranial Nerves: No cranial nerve deficit.  Psychiatric:        Mood and Affect: Mood normal.        Behavior: Behavior normal.        Thought Content: Thought content normal.        Judgment: Judgment normal.     Assessment and Plan   1. Irritable bowel syndrome with both constipation and diarrhea - clidinium-chlordiazePOXIDE (LIBRAX) 5-2.5 MG capsule; Take 1 capsule by mouth 2 (two) times daily.  Dispense: 60 capsule; Refill: 5  2. Impaired fasting blood sugar - CMP14+EGFR - Hemoglobin A1c  3. Screening, lipid - Lipid panel  4. Elevated liver enzymes - CMP14+EGFR  5. Elevated blood-pressure reading without diagnosis of hypertension   Pt stating just picked up last script of librax.  When eating  better bm is regular and not having constipation. Pt has been on this medication long term.  Symptoms are controlled if pt watches his diet.  Pt to get labs.  Elevated blood pressure- will recheck next visit.  Return in about 6 months (around 10/05/2021) for f/u IBS.   04/21/2021  BP Readings from Last 3 Encounters:  04/04/21 (!) 137/98  11/02/20 124/80  08/31/20 126/82

## 2021-04-05 ENCOUNTER — Ambulatory Visit: Payer: Federal, State, Local not specified - PPO | Admitting: Family Medicine

## 2021-04-05 LAB — CMP14+EGFR
ALT: 43 IU/L (ref 0–44)
AST: 37 IU/L (ref 0–40)
Albumin/Globulin Ratio: 1.4 (ref 1.2–2.2)
Albumin: 4.6 g/dL (ref 4.0–5.0)
Alkaline Phosphatase: 121 IU/L (ref 44–121)
BUN/Creatinine Ratio: 13 (ref 9–20)
BUN: 18 mg/dL (ref 6–24)
Bilirubin Total: 0.3 mg/dL (ref 0.0–1.2)
CO2: 21 mmol/L (ref 20–29)
Calcium: 9.2 mg/dL (ref 8.7–10.2)
Chloride: 102 mmol/L (ref 96–106)
Creatinine, Ser: 1.35 mg/dL — ABNORMAL HIGH (ref 0.76–1.27)
Globulin, Total: 3.2 g/dL (ref 1.5–4.5)
Glucose: 103 mg/dL — ABNORMAL HIGH (ref 65–99)
Potassium: 4.5 mmol/L (ref 3.5–5.2)
Sodium: 136 mmol/L (ref 134–144)
Total Protein: 7.8 g/dL (ref 6.0–8.5)
eGFR: 64 mL/min/{1.73_m2} (ref >59)

## 2021-04-05 LAB — LIPID PANEL
Chol/HDL Ratio: 3.1 ratio (ref 0.0–5.0)
Cholesterol, Total: 188 mg/dL (ref 100–199)
HDL: 60 mg/dL (ref >39)
LDL Chol Calc (NIH): 113 mg/dL — ABNORMAL HIGH (ref 0–99)
Triglycerides: 82 mg/dL (ref 0–149)
VLDL Cholesterol Cal: 15 mg/dL (ref 5–40)

## 2021-04-05 LAB — HEMOGLOBIN A1C
Est. average glucose Bld gHb Est-mCnc: 128 mg/dL
Hgb A1c MFr Bld: 6.1 % — ABNORMAL HIGH (ref 4.8–5.6)

## 2021-04-21 ENCOUNTER — Encounter: Payer: Self-pay | Admitting: Family Medicine

## 2021-04-21 DIAGNOSIS — R030 Elevated blood-pressure reading, without diagnosis of hypertension: Secondary | ICD-10-CM | POA: Insufficient documentation

## 2021-04-21 DIAGNOSIS — R7301 Impaired fasting glucose: Secondary | ICD-10-CM | POA: Insufficient documentation

## 2021-09-13 ENCOUNTER — Other Ambulatory Visit: Payer: Self-pay

## 2021-09-13 ENCOUNTER — Ambulatory Visit: Payer: Federal, State, Local not specified - PPO | Admitting: Family Medicine

## 2021-09-13 ENCOUNTER — Encounter: Payer: Self-pay | Admitting: Family Medicine

## 2021-09-13 VITALS — BP 126/82 | HR 107 | Temp 98.1°F | Ht 69.0 in | Wt 215.0 lb

## 2021-09-13 DIAGNOSIS — R030 Elevated blood-pressure reading, without diagnosis of hypertension: Secondary | ICD-10-CM | POA: Diagnosis not present

## 2021-09-13 DIAGNOSIS — K219 Gastro-esophageal reflux disease without esophagitis: Secondary | ICD-10-CM

## 2021-09-13 DIAGNOSIS — Z130 Encounter for screening for diseases of the blood and blood-forming organs and certain disorders involving the immune mechanism: Secondary | ICD-10-CM

## 2021-09-13 DIAGNOSIS — R7303 Prediabetes: Secondary | ICD-10-CM | POA: Diagnosis not present

## 2021-09-13 DIAGNOSIS — E785 Hyperlipidemia, unspecified: Secondary | ICD-10-CM

## 2021-09-13 DIAGNOSIS — K582 Mixed irritable bowel syndrome: Secondary | ICD-10-CM

## 2021-09-13 MED ORDER — PANTOPRAZOLE SODIUM 40 MG PO TBEC: 40.0000 mg | DELAYED_RELEASE_TABLET | Freq: Every day | ORAL | 0 refills | Status: DC | PRN

## 2021-09-13 NOTE — Patient Instructions (Signed)
Use the medication as directed.  Labs today.  Follow up in 6 months.  Take care  Dr. Lacinda Axon

## 2021-09-14 LAB — HEMOGLOBIN A1C
Est. average glucose Bld gHb Est-mCnc: 128 mg/dL
Hgb A1c MFr Bld: 6.1 % — ABNORMAL HIGH (ref 4.8–5.6)

## 2021-09-14 LAB — CBC
Hematocrit: 50.5 % (ref 37.5–51.0)
Hemoglobin: 16.7 g/dL (ref 13.0–17.7)
MCH: 27.9 pg (ref 26.6–33.0)
MCHC: 33.1 g/dL (ref 31.5–35.7)
MCV: 84 fL (ref 79–97)
Platelets: 288 10*3/uL (ref 150–450)
RBC: 5.98 x10E6/uL — ABNORMAL HIGH (ref 4.14–5.80)
RDW: 14.8 % (ref 11.6–15.4)
WBC: 7.4 10*3/uL (ref 3.4–10.8)

## 2021-09-14 LAB — CMP14+EGFR
ALT: 46 IU/L — ABNORMAL HIGH (ref 0–44)
AST: 32 IU/L (ref 0–40)
Albumin/Globulin Ratio: 1.2 (ref 1.2–2.2)
Albumin: 4.5 g/dL (ref 4.0–5.0)
Alkaline Phosphatase: 127 IU/L — ABNORMAL HIGH (ref 44–121)
BUN/Creatinine Ratio: 12 (ref 9–20)
BUN: 15 mg/dL (ref 6–24)
Bilirubin Total: 0.4 mg/dL (ref 0.0–1.2)
CO2: 22 mmol/L (ref 20–29)
Calcium: 9.9 mg/dL (ref 8.7–10.2)
Chloride: 100 mmol/L (ref 96–106)
Creatinine, Ser: 1.29 mg/dL — ABNORMAL HIGH (ref 0.76–1.27)
Globulin, Total: 3.7 g/dL (ref 1.5–4.5)
Glucose: 109 mg/dL — ABNORMAL HIGH (ref 70–99)
Potassium: 4.4 mmol/L (ref 3.5–5.2)
Sodium: 141 mmol/L (ref 134–144)
Total Protein: 8.2 g/dL (ref 6.0–8.5)
eGFR: 68 mL/min/{1.73_m2} (ref >59)

## 2021-09-14 LAB — LIPID PANEL
Chol/HDL Ratio: 4.8 ratio (ref 0.0–5.0)
Cholesterol, Total: 196 mg/dL (ref 100–199)
HDL: 41 mg/dL (ref >39)
LDL Chol Calc (NIH): 132 mg/dL — ABNORMAL HIGH (ref 0–99)
Triglycerides: 126 mg/dL (ref 0–149)
VLDL Cholesterol Cal: 23 mg/dL (ref 5–40)

## 2021-09-15 DIAGNOSIS — K219 Gastro-esophageal reflux disease without esophagitis: Secondary | ICD-10-CM | POA: Insufficient documentation

## 2021-09-15 NOTE — Assessment & Plan Note (Signed)
Protonix as needed.

## 2021-09-15 NOTE — Assessment & Plan Note (Signed)
A1C has returned unchanged. Continue to watch diet.

## 2021-09-15 NOTE — Assessment & Plan Note (Signed)
LDL elevated. Consider lipid lowering therapy.

## 2021-09-15 NOTE — Assessment & Plan Note (Signed)
Stable currently on Librax. Advised Miralax if needed for constipation.

## 2021-09-15 NOTE — Progress Notes (Signed)
Subjective:  Patient ID: Justin [name], male    DOB: 1971-03-11  Age: 50 y.o. MRN: [id number]  CC: Chief Complaint  Patient presents with   Establish Care    Last couple of days pt has had lots of heartburn. IBS check.     HPI:  50 year old male with IBS, Hyperlipidemia, Prediabetes presents for follow up.  IBS Experiences both constipation and diarrhea. Worse when he doesn't eat well and with stress. Currently experiencing more constipation. Uses Librax and states that it works well.   Prediabetes Hx of prediabetes. Would like A1C.  GERD Recent heartburn has been troublesome. Likely related to diet.  Would like to discuss treatment today.  Patient Active Problem List   Diagnosis Date Noted   Gastroesophageal reflux disease without esophagitis 09/15/2021   Hyperlipidemia 09/13/2021   Prediabetes 09/13/2021   Elevated blood-pressure reading without diagnosis of hypertension 04/21/2021   Irritable bowel syndrome with both constipation and diarrhea 09/17/2012    Social Hx   Social History   Socioeconomic History   Marital status: Single    Spouse name: Not on file   Number of children: 2   Years of education: Not on file   Highest education level: Not on file  Occupational History    Employer: [employer]  Tobacco Use   Smoking status: Some Days    Types: E-cigarettes   Smokeless tobacco: Never  Substance and Sexual Activity   Alcohol use: Yes    Comment: occasionally   Drug use: No   Sexual activity: Not on file  Other Topics Concern   Not on file  Social History Narrative   Not on file   Social Determinants of Health   Financial Resource Strain: Not on file  Food Insecurity: Not on file  Transportation Needs: Not on file  Physical Activity: Not on file  Stress: Not on file  Social Connections: Not on file    Review of Systems Per HPI  Objective:  BP 126/82    Pulse (!) 107    Temp 98.1 F (36.7 C)    Ht _0  (1.753 m)     Wt 215 lb (97.5 kg)    SpO2 98%    BMI 31.75 kg/m   BP/Weight 09/13/2021 05/01/7289 10/30/1153  Systolic BP 208 022 336  Diastolic BP 82 98 80  Wt. (Lbs) 215 223.8 221  BMI 31.75 33.05 32.64    Physical Exam Constitutional:      General: He is not in acute distress.    Appearance: Normal appearance. He is not ill-appearing.  HENT:     Head: Normocephalic and atraumatic.  Cardiovascular:     Rate and Rhythm: Normal rate and regular rhythm.  Pulmonary:     Effort: Pulmonary effort is normal.     Breath sounds: Normal breath sounds. No wheezing or rales.  Abdominal:     General: There is no distension.     Palpations: Abdomen is soft.     Tenderness: There is no abdominal tenderness.  Neurological:     Mental Status: He is alert.    Lab Results  Component Value Date   WBC 7.4 09/13/2021   HGB 16.7 09/13/2021   HCT 50.5 09/13/2021   PLT 288 09/13/2021   GLUCOSE 109 (H) 09/13/2021   CHOL 196 09/13/2021   TRIG 126 09/13/2021   HDL 41 09/13/2021   LDLCALC 132 (H) 09/13/2021   ALT 46 (H) 09/13/2021   AST 32 09/13/2021  NA 141 09/13/2021   K 4.4 09/13/2021   CL 100 09/13/2021   CREATININE 1.29 (H) 09/13/2021   BUN 15 09/13/2021   CO2 22 09/13/2021   PSA 0.69 03/09/2015   HGBA1C 6.1 (H) 09/13/2021     Assessment & Plan:   Problem List Items Addressed This Visit       Digestive   Irritable bowel syndrome with both constipation and diarrhea    Stable currently on Librax. Advised Miralax if needed for constipation.      Relevant Medications   pantoprazole (PROTONIX) 40 MG tablet   Gastroesophageal reflux disease without esophagitis - Primary    Protonix as needed.      Relevant Medications   pantoprazole (PROTONIX) 40 MG tablet     Other   Elevated blood-pressure reading without diagnosis of hypertension   Relevant Orders   CMP14+EGFR (Completed)   Hyperlipidemia    LDL elevated. Consider lipid lowering therapy.      Relevant Orders   Lipid panel  (Completed)   Prediabetes    A1C has returned unchanged. Continue to watch diet.      Relevant Orders   Hemoglobin A1c (Completed)   Other Visit Diagnoses     Screening for deficiency anemia       Relevant Orders   CBC (Completed)       Meds ordered this encounter  Medications   pantoprazole (PROTONIX) 40 MG tablet    Sig: Take 1 tablet (40 mg total) by mouth daily as needed (GERD).    Dispense:  90 tablet    Refill:  0    Follow-up:  6 months  [place] DO [place]

## 2021-10-22 ENCOUNTER — Telehealth: Payer: Self-pay | Admitting: Family Medicine

## 2021-10-22 NOTE — Telephone Encounter (Signed)
Pt would like a referral to a cardiologist from Dr Lacinda Axon. He states he has seen Dr Lacinda Axon for heartburn but he feels it may be something else.   [phone]

## 2021-10-23 ENCOUNTER — Other Ambulatory Visit: Payer: Self-pay | Admitting: Family Medicine

## 2021-10-23 DIAGNOSIS — R0789 Other chest pain: Secondary | ICD-10-CM

## 2021-10-24 NOTE — Telephone Encounter (Signed)
Left voicemail with patient identifying himself that referral was placed and if any questions to call back

## 2021-10-31 ENCOUNTER — Telehealth: Payer: Self-pay | Admitting: Family Medicine

## 2021-10-31 NOTE — Telephone Encounter (Signed)
[facility] requesting 90 day refill on Chlordiazepoxide-Clidinium Capsule. Take one capsule po BID. Please advise. Thank you

## 2021-11-01 ENCOUNTER — Other Ambulatory Visit: Payer: Self-pay | Admitting: Family Medicine

## 2021-11-01 DIAGNOSIS — K582 Mixed irritable bowel syndrome: Secondary | ICD-10-CM

## 2021-11-01 MED ORDER — CILIDINIUM-CHLORDIAZEPOXIDE 2.5-5 MG PO CAPS: 1.0000 | ORAL_CAPSULE | Freq: Two times a day (BID) | ORAL | 5 refills | Status: DC

## 2021-11-21 NOTE — Progress Notes (Signed)
Cardiology Office Note:    Date:  11/22/2021   ID:  Justin [name], DOB Sep 12, 1971, MRN [id number]  PCP:  Coral Spikes, DO  Cardiologist:  None   Referring MD: Coral Spikes, DO   Chief Complaint  Patient presents with   Chest Pain   Advice Only    Exertional palpitations    History of Present Illness:    Justin [name] is a 51 y.o. male with a hx of chest pressure, tachycardia, and dizziness.  H/O BRBPR,   He is concerned because there is a family history of coronary disease.  A paternal uncle has had multivessel bypass.  His mother does not have any heart issues.  The patient has a personal history of prediabetes, elevated cholesterol, and elevated blood pressure recordings without diagnosis of hypertension.  He is concerned because when he exercises, he gets palpitations and feels tight in his chest.  He feels his heart is pounding.  This has developed within the setting of relatively regular exercise over the past 2 years.  He has been noticing the symptoms for the past 6 months.  They are absent at rest.  Past Medical History:  Diagnosis Date   Anemia    IBS (irritable bowel syndrome)     Past Surgical History:  Procedure Laterality Date   COLONOSCOPY  2014    Current Medications: Current Meds  Medication Sig   clidinium-chlordiazePOXIDE (LIBRAX) 5-2.5 MG capsule Take 1 capsule by mouth 2 (two) times daily.   pantoprazole (PROTONIX) 40 MG tablet Take 1 tablet (40 mg total) by mouth daily as needed (GERD).   triamcinolone (KENALOG) 0.1 % APPLY TO AFFECTED AREA TWICE A DAY     Allergies:   Patient has no known allergies.   Social History   Socioeconomic History   Marital status: Divorced    Spouse name: Not on file   Number of children: 2   Years of education: Not on file   Highest education level: Not on file  Occupational History    Employer: [employer]  Tobacco Use   Smoking status: Some Days    Types: E-cigarettes   Smokeless  tobacco: Never  Substance and Sexual Activity   Alcohol use: Yes    Comment: occasionally   Drug use: No   Sexual activity: Not on file  Other Topics Concern   Not on file  Social History Narrative   Not on file   Social Determinants of Health   Financial Resource Strain: Not on file  Food Insecurity: Not on file  Transportation Needs: Not on file  Physical Activity: Not on file  Stress: Not on file  Social Connections: Not on file     Family History: The patient's family history includes Colon polyps in his maternal grandmother; Diabetes in his maternal grandmother; Hyperlipidemia in his mother; Hypertension in his mother; Irritable bowel syndrome in his maternal grandmother and mother; Sickle cell anemia in his father.  ROS:   Please see the history of present illness.    He does not have a personal history of heart disease.  He has never had a stroke.  He is overweight.  He used to smoke cigarettes.  All other systems reviewed and are negative.  EKGs/Labs/Other Studies Reviewed:    The following studies were reviewed today: Recent laboratory data: LDL cholesterol 132 December 2022; triglyceride 126 08/2021 Creatinine 1.26 September 2021 Fasting blood sugar D7 2022 109.  EKG:  EKG normal sinus rhythm with  early repolarization.  No prior tracings.  Date of this tracing November 22, 2021.  Recent Labs: 09/13/2021: ALT 46; BUN 15; Creatinine, Ser 1.29; Hemoglobin 16.7; Platelets 288; Potassium 4.4; Sodium 141  Recent Lipid Panel    Component Value Date/Time   CHOL 196 09/13/2021 1525   TRIG 126 09/13/2021 1525   HDL 41 09/13/2021 1525   CHOLHDL 4.8 09/13/2021 1525   CHOLHDL 3.6 03/09/2015 1227   VLDL 15 03/09/2015 1227   LDLCALC 132 (H) 09/13/2021 1525    Physical Exam:    VS:  BP 112/90    Pulse 87    Ht 5\' 9"  (1.753 m)    Wt 225 lb 9.6 oz (102.3 kg)    SpO2 98%    BMI 33.32 kg/m     Wt Readings from Last 3 Encounters:  11/22/21 225 lb 9.6 oz (102.3 kg)   09/13/21 215 lb (97.5 kg)  04/04/21 223 lb 12.8 oz (101.5 kg)     GEN: Overweight. No acute distress HEENT: Normal NECK: No JVD. LYMPHATICS: No lymphadenopathy CARDIAC: No murmur. RRR no gallop, or edema. VASCULAR:  Normal Pulses. No bruits. RESPIRATORY:  Clear to auscultation without rales, wheezing or rhonchi  ABDOMEN: Soft, non-tender, non-distended, No pulsatile mass, MUSCULOSKELETAL: No deformity  SKIN: Warm and dry NEUROLOGIC:  Alert and oriented x 3 PSYCHIATRIC:  Normal affect   ASSESSMENT:    1. Chest pain of uncertain etiology   2. Prediabetes   3. Hyperlipidemia, unspecified hyperlipidemia type   4. Elevated blood-pressure reading without diagnosis of hypertension    PLAN:    In order of problems listed above:  Exercise treadmill test reproduce symptoms.  Coronary calcium score to define burden of atherosclerosis if any. May need more aggressive therapy if calcium score is elevated May need aggressive therapy if calcium score is elevated or evidence of ischemia noted on treadmill May need treatment of hypertensive blood pressure response is noted.   Overall education and awareness concerning primary risk prevention was discussed in detail: LDL less than 70, hemoglobin A1c less than 7, blood pressure target less than 130/80 mmHg, >150 minutes of moderate aerobic activity per week, avoidance of smoking, weight control (via diet and exercise), and continued surveillance/management of/for obstructive sleep apnea.    Medication Adjustments/Labs and Tests Ordered: Current medicines are reviewed at length with the patient today.  Concerns regarding medicines are outlined above.  Orders Placed This Encounter  Procedures   CT CARDIAC SCORING (SELF PAY ONLY)   Exercise Tolerance Test   EKG 12-Lead   No orders of the defined types were placed in this encounter.   Patient Instructions  Medication Instructions:  Your physician recommends that you continue on your  current medications as directed. Please refer to the Current Medication list given to you today.  *If you need a refill on your cardiac medications before your next appointment, please call your pharmacy*   Lab Work: None If you have labs (blood work) drawn today and your tests are completely normal, you will receive your results only by: [place] (if you have MyChart) OR A paper copy in the mail If you have any lab test that is abnormal or we need to change your treatment, we will call you to review the results.   Testing/Procedures: Your physician has requested that you have an exercise tolerance test. For further information please visit [email or web address]. Please also follow instruction sheet, as given.  Your physician recommends that you have a Calcium Score performed.  Follow-Up: At [facility], you and your health needs are our priority.  As part of our continuing mission to provide you with exceptional heart care, we have created designated Provider Care Teams.  These Care Teams include your primary Cardiologist (physician) and Advanced Practice Providers (APPs -  Physician Assistants and Nurse Practitioners) who all work together to provide you with the care you need, when you need it.  We recommend signing up for the patient portal called "MyChart".  Sign up information is provided on this After Visit Summary.  MyChart is used to connect with patients for Virtual Visits (Telemedicine).  Patients are able to view lab/test results, encounter notes, upcoming appointments, etc.  Non-urgent messages can be sent to your provider as well.   To learn more about what you can do with MyChart, go to [email or web address].    Your next appointment:   As needed  The format for your next appointment:   In Person  Provider:   [facility]. Blenda Bridegroom, MD    Other Instructions     Signed, Sinclair Grooms, MD  11/22/2021 3:07 PM    [facility]

## 2021-11-22 ENCOUNTER — Other Ambulatory Visit: Payer: Self-pay

## 2021-11-22 ENCOUNTER — Ambulatory Visit: Payer: Federal, State, Local not specified - PPO | Admitting: Interventional Cardiology

## 2021-11-22 ENCOUNTER — Encounter: Payer: Self-pay | Admitting: Interventional Cardiology

## 2021-11-22 VITALS — BP 112/90 | HR 87 | Ht 69.0 in | Wt 225.6 lb

## 2021-11-22 DIAGNOSIS — R030 Elevated blood-pressure reading, without diagnosis of hypertension: Secondary | ICD-10-CM | POA: Diagnosis not present

## 2021-11-22 DIAGNOSIS — R079 Chest pain, unspecified: Secondary | ICD-10-CM

## 2021-11-22 DIAGNOSIS — E785 Hyperlipidemia, unspecified: Secondary | ICD-10-CM | POA: Diagnosis not present

## 2021-11-22 DIAGNOSIS — R7303 Prediabetes: Secondary | ICD-10-CM

## 2021-11-22 NOTE — Patient Instructions (Signed)
Medication Instructions:  Your physician recommends that you continue on your current medications as directed. Please refer to the Current Medication list given to you today.  *If you need a refill on your cardiac medications before your next appointment, please call your pharmacy*   Lab Work: None If you have labs (blood work) drawn today and your tests are completely normal, you will receive your results only by: [place] (if you have MyChart) OR A paper copy in the mail If you have any lab test that is abnormal or we need to change your treatment, we will call you to review the results.   Testing/Procedures: Your physician has requested that you have an exercise tolerance test. For further information please visit [email or web address]. Please also follow instruction sheet, as given.  Your physician recommends that you have a Calcium Score performed.   Follow-Up: At [facility], you and your health needs are our priority.  As part of our continuing mission to provide you with exceptional heart care, we have created designated Provider Care Teams.  These Care Teams include your primary Cardiologist (physician) and Advanced Practice Providers (APPs -  Physician Assistants and Nurse Practitioners) who all work together to provide you with the care you need, when you need it.  We recommend signing up for the patient portal called "MyChart".  Sign up information is provided on this After Visit Summary.  MyChart is used to connect with patients for Virtual Visits (Telemedicine).  Patients are able to view lab/test results, encounter notes, upcoming appointments, etc.  Non-urgent messages can be sent to your provider as well.   To learn more about what you can do with MyChart, go to [email or web address].    Your next appointment:   As needed  The format for your next appointment:   In Person  Provider:   [facility]. Blenda Bridegroom, MD    Other Instructions

## 2021-12-05 ENCOUNTER — Ambulatory Visit (INDEPENDENT_AMBULATORY_CARE_PROVIDER_SITE_OTHER): Payer: Federal, State, Local not specified - PPO

## 2021-12-05 ENCOUNTER — Other Ambulatory Visit: Payer: Self-pay

## 2021-12-05 DIAGNOSIS — E785 Hyperlipidemia, unspecified: Secondary | ICD-10-CM | POA: Diagnosis not present

## 2021-12-05 DIAGNOSIS — R7303 Prediabetes: Secondary | ICD-10-CM

## 2021-12-05 DIAGNOSIS — R030 Elevated blood-pressure reading, without diagnosis of hypertension: Secondary | ICD-10-CM | POA: Diagnosis not present

## 2021-12-05 DIAGNOSIS — R079 Chest pain, unspecified: Secondary | ICD-10-CM

## 2021-12-05 LAB — EXERCISE TOLERANCE TEST
Angina Index: 0
Duke Treadmill Score: 7
Estimated workload: 8.5
Exercise duration (min): 7 min
Exercise duration (sec): 0 s
MPHR: 170 {beats}/min
Peak HR: 151 {beats}/min
Percent HR: 88 %
RPE: 17
Rest HR: 86 {beats}/min
ST Depression (mm): 0 mm

## 2021-12-20 ENCOUNTER — Encounter: Payer: Self-pay | Admitting: *Deleted

## 2022-01-03 ENCOUNTER — Other Ambulatory Visit: Payer: Self-pay | Admitting: *Deleted

## 2022-01-03 DIAGNOSIS — K582 Mixed irritable bowel syndrome: Secondary | ICD-10-CM

## 2022-01-03 DIAGNOSIS — L309 Dermatitis, unspecified: Secondary | ICD-10-CM

## 2022-01-03 MED ORDER — METOPROLOL SUCCINATE ER 25 MG PO TB24: 25.0000 mg | ORAL_TABLET | Freq: Every day | ORAL | 3 refills | Status: DC

## 2022-01-10 ENCOUNTER — Ambulatory Visit (INDEPENDENT_AMBULATORY_CARE_PROVIDER_SITE_OTHER)
Admission: RE | Admit: 2022-01-10 | Discharge: 2022-01-10 | Disposition: A | Discharge status: Home / Self Care | Payer: Self-pay | Source: Ambulatory Visit | Attending: Interventional Cardiology | Admitting: Interventional Cardiology

## 2022-01-10 DIAGNOSIS — E785 Hyperlipidemia, unspecified: Secondary | ICD-10-CM

## 2022-01-10 DIAGNOSIS — R030 Elevated blood-pressure reading, without diagnosis of hypertension: Secondary | ICD-10-CM

## 2022-01-10 DIAGNOSIS — R079 Chest pain, unspecified: Secondary | ICD-10-CM

## 2022-01-10 DIAGNOSIS — R7303 Prediabetes: Secondary | ICD-10-CM

## 2022-02-06 ENCOUNTER — Encounter: Payer: Self-pay | Admitting: Podiatry

## 2022-02-26 ENCOUNTER — Encounter: Payer: Self-pay | Admitting: Interventional Cardiology

## 2022-02-28 ENCOUNTER — Encounter: Payer: Federal, State, Local not specified - PPO | Admitting: Family Medicine

## 2022-04-02 ENCOUNTER — Ambulatory Visit (INDEPENDENT_AMBULATORY_CARE_PROVIDER_SITE_OTHER): Payer: Federal, State, Local not specified - PPO | Admitting: Family Medicine

## 2022-04-02 ENCOUNTER — Other Ambulatory Visit: Payer: Self-pay | Admitting: Family Medicine

## 2022-04-02 VITALS — BP 130/72 | HR 83 | Temp 97.4°F | Ht 69.0 in | Wt 232.2 lb

## 2022-04-02 DIAGNOSIS — Z1211 Encounter for screening for malignant neoplasm of colon: Secondary | ICD-10-CM

## 2022-04-02 DIAGNOSIS — Z125 Encounter for screening for malignant neoplasm of prostate: Secondary | ICD-10-CM | POA: Diagnosis not present

## 2022-04-02 DIAGNOSIS — Z130 Encounter for screening for diseases of the blood and blood-forming organs and certain disorders involving the immune mechanism: Secondary | ICD-10-CM | POA: Diagnosis not present

## 2022-04-02 DIAGNOSIS — R931 Abnormal findings on diagnostic imaging of heart and coronary circulation: Secondary | ICD-10-CM | POA: Diagnosis not present

## 2022-04-02 DIAGNOSIS — R7303 Prediabetes: Secondary | ICD-10-CM | POA: Diagnosis not present

## 2022-04-02 DIAGNOSIS — Z1159 Encounter for screening for other viral diseases: Secondary | ICD-10-CM

## 2022-04-02 DIAGNOSIS — E785 Hyperlipidemia, unspecified: Secondary | ICD-10-CM | POA: Diagnosis not present

## 2022-04-02 DIAGNOSIS — Z23 Encounter for immunization: Secondary | ICD-10-CM | POA: Diagnosis not present

## 2022-04-02 DIAGNOSIS — Z0000 Encounter for general adult medical examination without abnormal findings: Secondary | ICD-10-CM

## 2022-04-02 DIAGNOSIS — R748 Abnormal levels of other serum enzymes: Secondary | ICD-10-CM

## 2022-04-02 DIAGNOSIS — Z114 Encounter for screening for human immunodeficiency virus [HIV]: Secondary | ICD-10-CM

## 2022-04-02 MED ORDER — PANTOPRAZOLE SODIUM 40 MG PO TBEC: 40.0000 mg | DELAYED_RELEASE_TABLET | Freq: Every day | ORAL | 0 refills | Status: DC | PRN

## 2022-04-02 MED ORDER — ROSUVASTATIN CALCIUM 10 MG PO TABS: 10.0000 mg | ORAL_TABLET | Freq: Every day | ORAL | 3 refills | Status: DC

## 2022-04-02 NOTE — Progress Notes (Signed)
Subjective:  Patient ID: Justin [name], male    DOB: 1971/01/20  Age: 51 y.o. MRN: [id number]  CC: Chief Complaint  Patient presents with   well exam    Also being seen for IBS    HPI:  51 year old male with IBS, GERD, hyperlipidemia, prediabetes presents for an annual exam.  Patient states that he is doing well.  Denies chest pain or shortness of breath.  Preventative Healthcare Colonoscopy: Due. Immunizations Tetanus - In need of. Pneumococcal - Not indicated. Shingles - In need of. Prostate cancer screening: Will order PSA today. Hepatitis C screening - Would like screening. Labs: Needs labs today. Alcohol use: Yes. Smoking/tobacco use: Vapes. STD/HIV testing: Would like HIV screening.  Elevated coronary calcium Patient recently seen by cardiology.  Patient had an elevated coronary calcium score in the 95th percentile.  Score of 116 -LAD. Also had stress test. Has a family history of cardiovascular disease.  Needs aggressive management. Will discuss results today (Cardiology had trouble reaching the patient).   Patient Active Problem List   Diagnosis Date Noted   Annual physical exam 04/02/2022   Elevated coronary artery calcium score 04/02/2022   Gastroesophageal reflux disease without esophagitis 09/15/2021   Hyperlipidemia 09/13/2021   Prediabetes 09/13/2021   Irritable bowel syndrome with both constipation and diarrhea 09/17/2012    Social Hx   Social History   Socioeconomic History   Marital status: Divorced    Spouse name: Not on file   Number of children: 2   Years of education: Not on file   Highest education level: Not on file  Occupational History    Employer: [employer]  Tobacco Use   Smoking status: Some Days    Types: E-cigarettes   Smokeless tobacco: Never  Substance and Sexual Activity   Alcohol use: Yes    Comment: occasionally   Drug use: No   Sexual activity: Not on file  Other Topics Concern   Not on file   Social History Narrative   Not on file   Social Determinants of Health   Financial Resource Strain: Not on file  Food Insecurity: Not on file  Transportation Needs: Not on file  Physical Activity: Not on file  Stress: Not on file  Social Connections: Not on file    Review of Systems  Constitutional: Negative.   Respiratory: Negative.    Cardiovascular: Negative.     Objective:  BP 130/72   Pulse 83   Temp (!) 97.4 F (36.3 C) (Oral)   Ht _0  (1.753 m)   Wt 232 lb 3.2 oz (105.3 kg)   SpO2 95%   BMI 34.29 kg/m      04/02/2022    9:32 AM 04/02/2022    9:07 AM 11/22/2021    2:14 PM  BP/Weight  Systolic BP 229 798 921  Diastolic BP 72 91 90  Wt. (Lbs)  232.2 225.6  BMI  34.29 kg/m2 33.32 kg/m2    Physical Exam Vitals and nursing note reviewed.  Constitutional:      General: He is not in acute distress.    Appearance: Normal appearance. He is not ill-appearing.  HENT:     Head: Normocephalic and atraumatic.  Eyes:     General:        Right eye: No discharge.        Left eye: No discharge.     Conjunctiva/sclera: Conjunctivae normal.  Cardiovascular:     Rate and Rhythm: Normal rate  and regular rhythm.  Pulmonary:     Effort: Pulmonary effort is normal.     Breath sounds: Normal breath sounds. No wheezing, rhonchi or rales.  Neurological:     Mental Status: He is alert.  Psychiatric:        Mood and Affect: Mood normal.        Behavior: Behavior normal.     Lab Results  Component Value Date   WBC 7.4 09/13/2021   HGB 16.7 09/13/2021   HCT 50.5 09/13/2021   PLT 288 09/13/2021   GLUCOSE 109 (H) 09/13/2021   CHOL 196 09/13/2021   TRIG 126 09/13/2021   HDL 41 09/13/2021   LDLCALC 132 (H) 09/13/2021   ALT 46 (H) 09/13/2021   AST 32 09/13/2021   NA 141 09/13/2021   K 4.4 09/13/2021   CL 100 09/13/2021   CREATININE 1.29 (H) 09/13/2021   BUN 15 09/13/2021   CO2 22 09/13/2021   PSA 0.69 03/09/2015   HGBA1C 6.1 (H) 09/13/2021     Assessment &  Plan:   Problem List Items Addressed This Visit       Cardiovascular and Mediastinum   Elevated coronary artery calcium score    We discussed his recent CT coronary.  Given his family history as well as the coronary calcium scoring, I recommend statin therapy.  Placing on Crestor.      Relevant Medications   rosuvastatin (CRESTOR) 10 MG tablet     Other   Hyperlipidemia    Uncontrolled.  Placing on Crestor.      Relevant Medications   rosuvastatin (CRESTOR) 10 MG tablet   Other Relevant Orders   Lipid panel   Prediabetes   Relevant Orders   CMP14+EGFR   Hemoglobin A1c   Annual physical exam - Primary    Patient is doing well at this time.  Preventative health care: Screening HIV and hepatitis C testing today. Routine labs ordered. Tdap given today. Referral placed for colonoscopy. Patient states that he will contact his pharmacy regarding shingles vaccine.      Other Visit Diagnoses     Prostate cancer screening       Relevant Orders   PSA   Elevated alkaline phosphatase level       Relevant Orders   Gamma GT   Screening for deficiency anemia       Relevant Orders   CBC   Encounter for screening colonoscopy       Relevant Orders   Ambulatory referral to Gastroenterology   Screening for HIV (human immunodeficiency virus)       Relevant Orders   HIV antibody (with reflex)   Encounter for hepatitis C screening test for low risk patient       Relevant Orders   Hepatitis C Antibody   Need for vaccination       Relevant Orders   Tdap vaccine greater than or equal to 7yo IM (Completed)       Meds ordered this encounter  Medications   rosuvastatin (CRESTOR) 10 MG tablet    Sig: Take 1 tablet (10 mg total) by mouth daily.    Dispense:  90 tablet    Refill:  3    Follow-up:  6 months  [place]

## 2022-04-02 NOTE — Patient Instructions (Signed)
Labs today.  Continue Metoprolol.  Start Crestor.  Follow up in 6 months.  Take care  Dr. Lacinda Axon

## 2022-04-02 NOTE — Assessment & Plan Note (Addendum)
Patient is doing well at this time.  Preventative health care: Screening HIV and hepatitis C testing today. Routine labs ordered. Tdap given today. Referral placed for colonoscopy. Patient states that he will contact his pharmacy regarding shingles vaccine.

## 2022-04-02 NOTE — Assessment & Plan Note (Signed)
Uncontrolled.  Placing on Crestor.

## 2022-04-02 NOTE — Assessment & Plan Note (Signed)
We discussed his recent CT coronary.  Given his family history as well as the coronary calcium scoring, I recommend statin therapy.  Placing on Crestor.

## 2022-04-03 ENCOUNTER — Encounter (INDEPENDENT_AMBULATORY_CARE_PROVIDER_SITE_OTHER): Payer: Self-pay | Admitting: *Deleted

## 2022-04-03 ENCOUNTER — Other Ambulatory Visit: Payer: Self-pay | Admitting: Family Medicine

## 2022-04-03 LAB — CBC
Hematocrit: 47.4 % (ref 37.5–51.0)
Hemoglobin: 16 g/dL (ref 13.0–17.7)
MCH: 28.5 pg (ref 26.6–33.0)
MCHC: 33.8 g/dL (ref 31.5–35.7)
MCV: 85 fL (ref 79–97)
Platelets: 286 10*3/uL (ref 150–450)
RBC: 5.61 x10E6/uL (ref 4.14–5.80)
RDW: 15.2 % (ref 11.6–15.4)
WBC: 6.5 10*3/uL (ref 3.4–10.8)

## 2022-04-03 LAB — CMP14+EGFR
ALT: 24 IU/L (ref 0–44)
AST: 15 IU/L (ref 0–40)
Albumin/Globulin Ratio: 1.3 (ref 1.2–2.2)
Albumin: 4.6 g/dL (ref 4.0–5.0)
Alkaline Phosphatase: 128 IU/L — ABNORMAL HIGH (ref 44–121)
BUN/Creatinine Ratio: 17 (ref 9–20)
BUN: 20 mg/dL (ref 6–24)
Bilirubin Total: 0.6 mg/dL (ref 0.0–1.2)
CO2: 19 mmol/L — ABNORMAL LOW (ref 20–29)
Calcium: 9.7 mg/dL (ref 8.7–10.2)
Chloride: 101 mmol/L (ref 96–106)
Creatinine, Ser: 1.18 mg/dL (ref 0.76–1.27)
Globulin, Total: 3.5 g/dL (ref 1.5–4.5)
Glucose: 148 mg/dL — ABNORMAL HIGH (ref 70–99)
Potassium: 4.3 mmol/L (ref 3.5–5.2)
Sodium: 138 mmol/L (ref 134–144)
Total Protein: 8.1 g/dL (ref 6.0–8.5)
eGFR: 75 mL/min/{1.73_m2} (ref >59)

## 2022-04-03 LAB — HEMOGLOBIN A1C
Est. average glucose Bld gHb Est-mCnc: 128 mg/dL
Hgb A1c MFr Bld: 6.1 % — ABNORMAL HIGH (ref 4.8–5.6)

## 2022-04-03 LAB — LIPID PANEL
Chol/HDL Ratio: 3.9 ratio (ref 0.0–5.0)
Cholesterol, Total: 223 mg/dL — ABNORMAL HIGH (ref 100–199)
HDL: 57 mg/dL (ref >39)
LDL Chol Calc (NIH): 138 mg/dL — ABNORMAL HIGH (ref 0–99)
Triglycerides: 158 mg/dL — ABNORMAL HIGH (ref 0–149)
VLDL Cholesterol Cal: 28 mg/dL (ref 5–40)

## 2022-04-03 LAB — PSA: Prostate Specific Ag, Serum: 0.5 ng/mL (ref 0.0–4.0)

## 2022-04-03 LAB — HEPATITIS C ANTIBODY: Hep C Virus Ab: NONREACTIVE

## 2022-04-03 LAB — HIV ANTIBODY (ROUTINE TESTING W REFLEX): HIV Screen 4th Generation wRfx: NONREACTIVE

## 2022-04-03 LAB — GAMMA GT: GGT: 45 IU/L (ref 0–65)

## 2022-04-04 ENCOUNTER — Encounter: Payer: Federal, State, Local not specified - PPO | Admitting: Family Medicine

## 2022-04-04 NOTE — Telephone Encounter (Signed)
Patient can only have 90 tablets for the whole year and he has exceeded his 90 tablets. A prior authorization will have to be done or can change medication.

## 2022-04-10 ENCOUNTER — Other Ambulatory Visit: Payer: Self-pay | Admitting: Family Medicine

## 2022-04-12 ENCOUNTER — Other Ambulatory Visit: Payer: Self-pay | Admitting: Family Medicine

## 2022-05-12 IMAGING — CT CT CARDIAC CORONARY ARTERY CALCIUM SCORE
3 series · 13 of 20 positions shown, 15 images · non-contrast
Comparison: None.

Addendum:
CLINICAL DATA: Risk stratification: 51 Year-old African American
Male

EXAM:
Coronary Calcium Score
TECHNIQUE: The patient was scanned on a Siemens Force scanner. Axial
non-contrast 3 mm slices were carried out through the heart. The
data set was analyzed on a dedicated work station and scored using
the Agatson method.

[Series 2: cascseq 2.0 sa36 (id) (id) · axial · 0.40mm/px · z∈[-213,-133]mm · 4 of 68 slices shown]
[im 14/68  vessel]
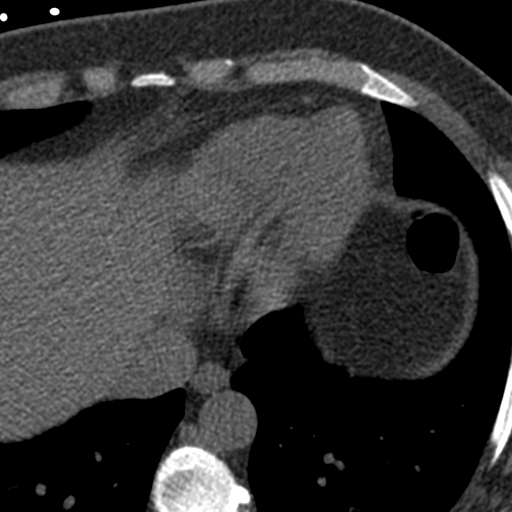
[im 27/68  vessel]
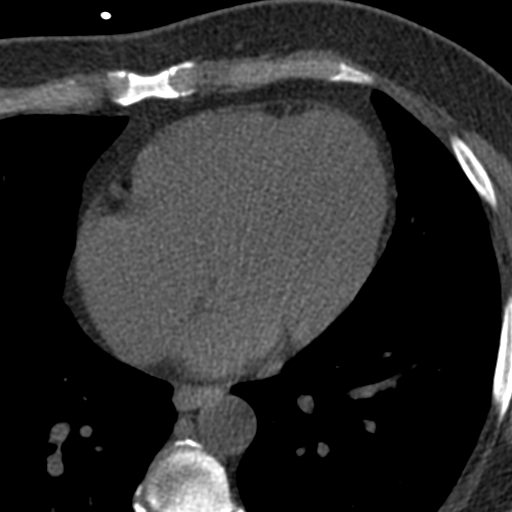
[im 41/68  vessel]
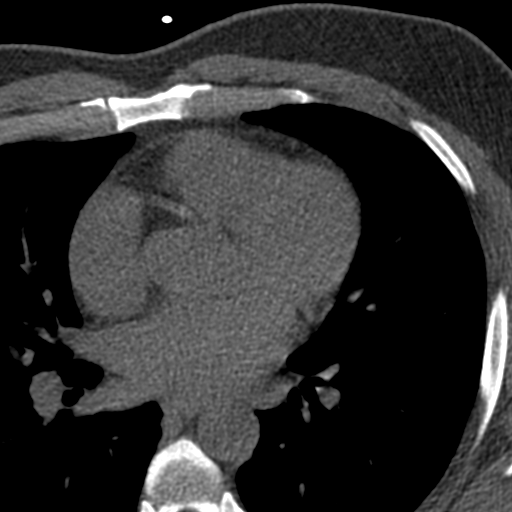
[im 54/68  vessel]
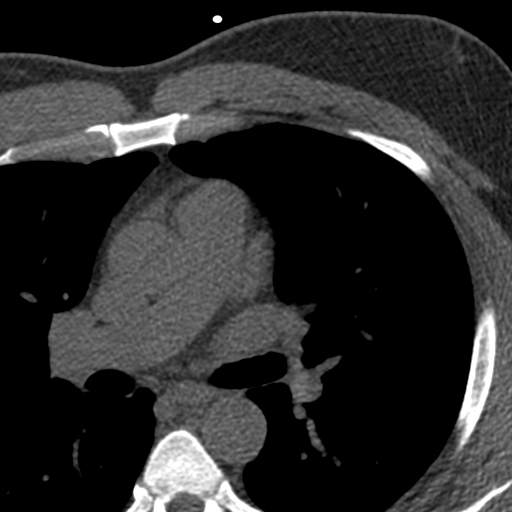

[Series 3: cascseq 2.0 bf37 st · axial · 0.65mm/px · z∈[-217,-129]mm · 5 of 68 slices shown, 7 images]
[im 12/68  vessel]
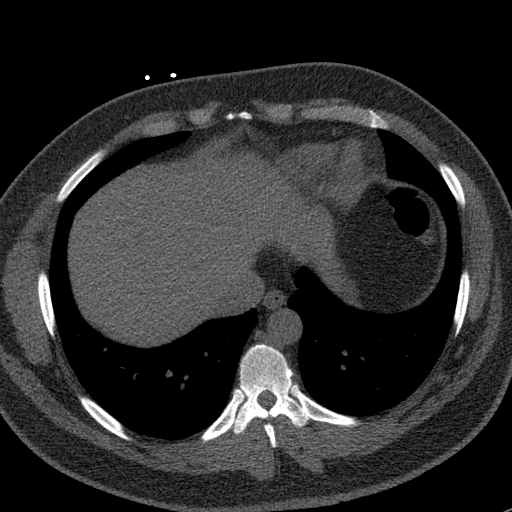
[im 12/68  lung]
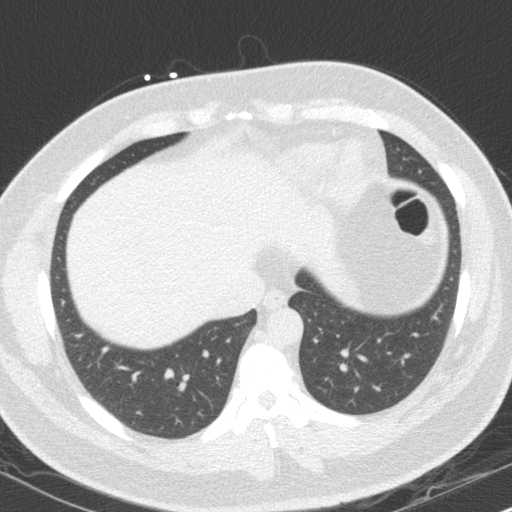
[im 23/68  vessel]
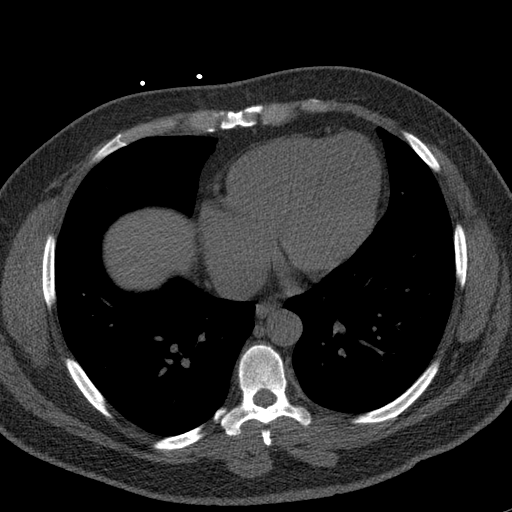
[im 34/68  vessel]
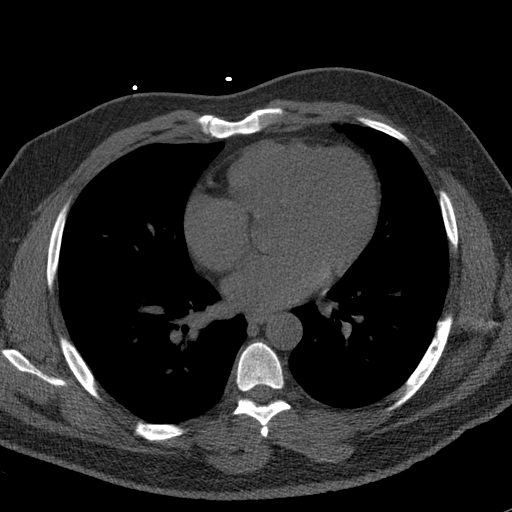
[im 45/68  vessel]
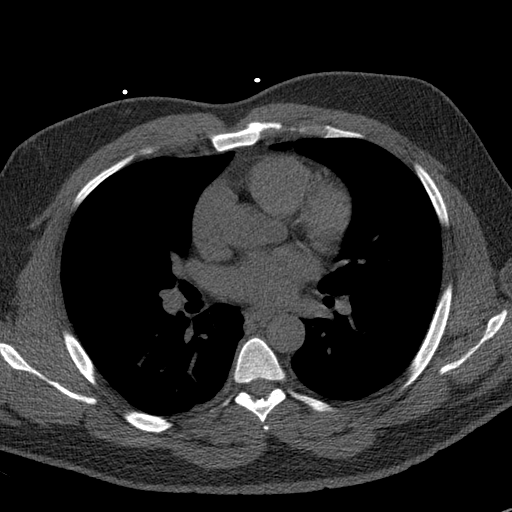
[im 56/68  vessel]
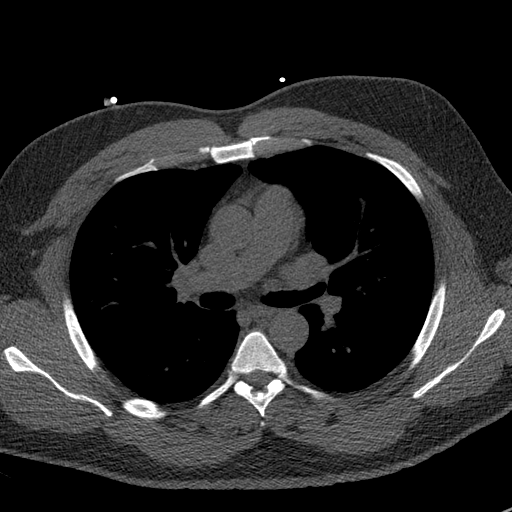
[im 56/68  lung]
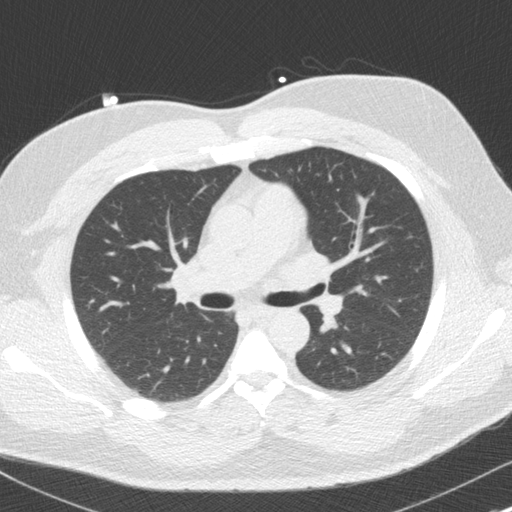

[Series 4: cascseq 2.0 br59 lung · axial · 0.65mm/px · z∈[-217,-151]mm · 4 of 68 slices shown]
[im 12/68  lung]
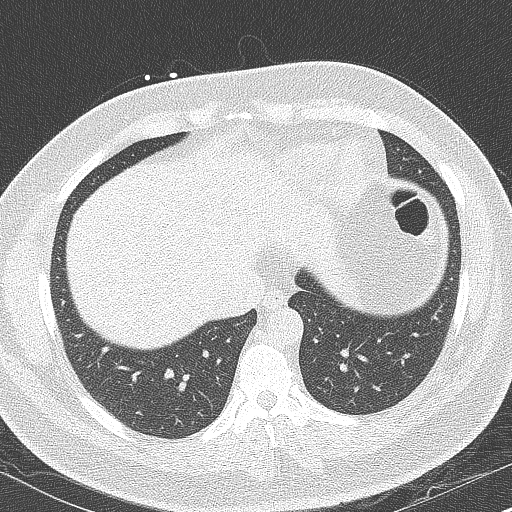
[im 23/68  lung]
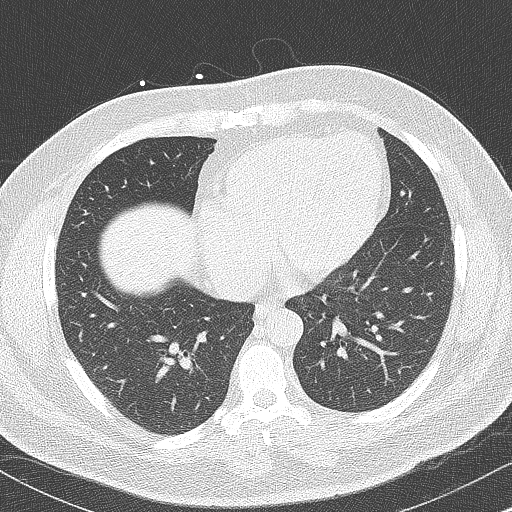
[im 34/68  lung]
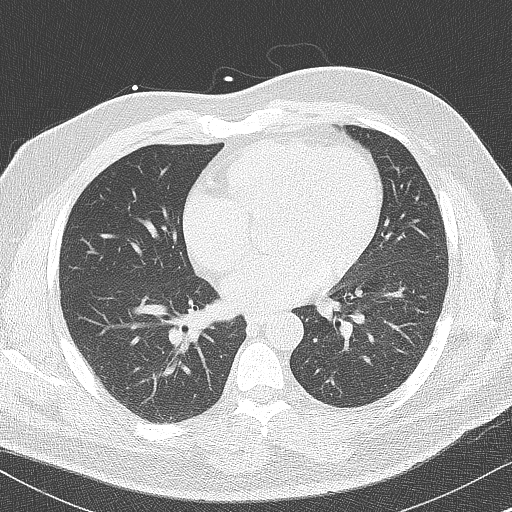
[im 45/68  lung]
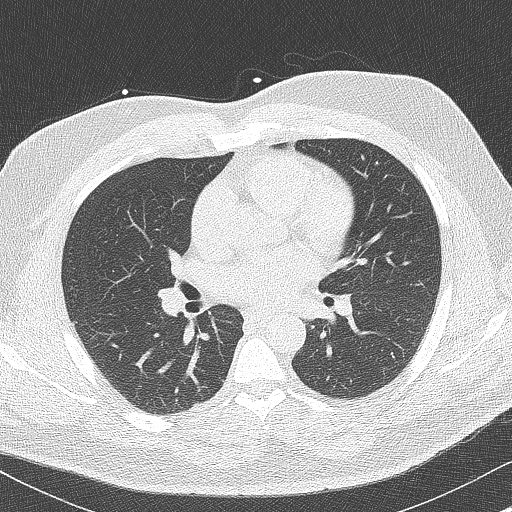

[13 of 20 positions shown; findings below may reference images not displayed]

FINDINGS: Non-cardiac: See separate report from [REDACTED].

Ascending Aorta: Normal caliber.

Pericardium: Normal.

Coronary arteries: Normal origins.

Coronary Calcium Score:

Left main: 0

Left anterior descending artery: 116

Left circumflex artery: 0

Right coronary artery: 3

Ramus intermedius artery: 0

Total: 119

Percentile: 95th for age, sex, and race matched control.
IMPRESSION: 1. Coronary calcium score of 119. This was 95th percentile for age,
gender, and race matched controls.

RECOMMENDATIONS:



If CAC = 0, it is reasonable to withhold statin therapy and reassess
in 5 to 10 years, as long as higher risk conditions are absent
(diabetes mellitus, family history of premature CHD in first degree
relatives (males <55 years; females <65 years), cigarette smoking,
LDL >=190 mg/dL or other independent risk factors).

If CAC is 1 to 99, it is reasonable to initiate statin therapy for
patients >=55 years of age.

If CAC is >=100 or >=75th percentile, it is reasonable to initiate
statin therapy at any age.

Cardiology referral should be considered for patients with CAC
scores =400 or >=75th percentile.

*4781 AHA/ACC/AACVPR/AAPA/ABC/YAHYAOUI/PINGITORE/AJA/Ebadat/POHL/MATTOUSSI/JAYMEE
Guideline on the Management of Blood Cholesterol: A Report of the
American College of Cardiology/American Heart Association Task Force
on Clinical Practice Guidelines. J Am Coll Cardiol.
3109;73(24):2165-2837.

EXAM:
OVER-READ INTERPRETATION  CT CHEST

The following report is an over-read performed by radiologist Dr.
over-read does not include interpretation of cardiac or coronary
anatomy or pathology. The coronary calcium score interpretation by
the cardiologist is attached.
FINDINGS: Vascular: Normal heart size. No pericardial effusion. Normal caliber
thoracic aorta with no atherosclerotic disease.

Mediastinum/Nodes: Esophagus is unremarkable. No pathologically
enlarged lymph nodes seen in the chest.

Lungs/Pleura: Central airways are patent. No consolidation, pleural
effusion or pneumothorax. Small solid pulmonary nodule of the right
upper lobe measuring 3 mm on series 4, image 10.

Upper Abdomen: No acute abnormality.

Musculoskeletal: No chest wall mass or suspicious bone lesions
identified.
IMPRESSION: Small solid pulmonary nodule of the right upper lobe measuring 3 mm.
No follow-up needed if patient is low-risk.This recommendation
follows the consensus statement: Guidelines for Management of
Incidental Pulmonary Nodules Detected on CT Images: From the

*** End of Addendum ***
FINDINGS: Non-cardiac: See separate report from [REDACTED].

Ascending Aorta: Normal caliber.

Pericardium: Normal.

Coronary arteries: Normal origins.

Coronary Calcium Score:

Left main: 0

Left anterior descending artery: 116

Left circumflex artery: 0

Right coronary artery: 3

Ramus intermedius artery: 0

Total: 119

Percentile: 95th for age, sex, and race matched control.
IMPRESSION: 1. Coronary calcium score of 119. This was 95th percentile for age,
gender, and race matched controls.

RECOMMENDATIONS:



If CAC = 0, it is reasonable to withhold statin therapy and reassess
in 5 to 10 years, as long as higher risk conditions are absent
(diabetes mellitus, family history of premature CHD in first degree
relatives (males <55 years; females <65 years), cigarette smoking,
LDL >=190 mg/dL or other independent risk factors).

If CAC is 1 to 99, it is reasonable to initiate statin therapy for
patients >=55 years of age.

If CAC is >=100 or >=75th percentile, it is reasonable to initiate
statin therapy at any age.

Cardiology referral should be considered for patients with CAC
scores =400 or >=75th percentile.

*4781 AHA/ACC/AACVPR/AAPA/ABC/YAHYAOUI/PINGITORE/AJA/Ebadat/POHL/MATTOUSSI/JAYMEE
Guideline on the Management of Blood Cholesterol: A Report of the
American College of Cardiology/American Heart Association Task Force
on Clinical Practice Guidelines. J Am Coll Cardiol.
3109;73(24):2165-2837.

## 2022-05-22 ENCOUNTER — Other Ambulatory Visit: Payer: Self-pay | Admitting: *Deleted

## 2022-05-22 DIAGNOSIS — K582 Mixed irritable bowel syndrome: Secondary | ICD-10-CM

## 2022-05-22 MED ORDER — CILIDINIUM-CHLORDIAZEPOXIDE 2.5-5 MG PO CAPS: 1.0000 | ORAL_CAPSULE | Freq: Two times a day (BID) | ORAL | 5 refills | Status: DC

## 2022-05-28 ENCOUNTER — Encounter: Payer: Self-pay | Admitting: Interventional Cardiology

## 2022-05-28 ENCOUNTER — Ambulatory Visit
Payer: Federal, State, Local not specified - PPO | Attending: Interventional Cardiology | Admitting: Interventional Cardiology

## 2022-05-28 VITALS — BP 120/84 | HR 90 | Ht 69.0 in | Wt 233.0 lb

## 2022-05-28 DIAGNOSIS — R931 Abnormal findings on diagnostic imaging of heart and coronary circulation: Secondary | ICD-10-CM

## 2022-05-28 DIAGNOSIS — R7303 Prediabetes: Secondary | ICD-10-CM | POA: Diagnosis not present

## 2022-05-28 DIAGNOSIS — E785 Hyperlipidemia, unspecified: Secondary | ICD-10-CM

## 2022-05-28 DIAGNOSIS — R030 Elevated blood-pressure reading, without diagnosis of hypertension: Secondary | ICD-10-CM | POA: Diagnosis not present

## 2022-05-28 MED ORDER — METOPROLOL SUCCINATE ER 50 MG PO TB24: 50.0000 mg | ORAL_TABLET | Freq: Every day | ORAL | 3 refills | Status: DC

## 2022-05-28 NOTE — Progress Notes (Signed)
Cardiology Office Note:    Date:  05/28/2022   ID:  Justin [name], DOB 1971-09-06, MRN [id number]  PCP:  Coral Spikes, DO  Cardiologist:  None   Referring MD: Coral Spikes, DO   Chief Complaint  Patient presents with   Hypertension   Chest Pain    History of Present Illness:    Justin [name] is a 51 y.o. male with a hx of chest pressure, tachycardia, hypertensive blood pressure response with exercise, elevated coronary calcium score (119), hyperlipidemia, and dizziness.  Justin [name] feels better.  Chest discomfort is improved on low-dose metoprolol.  In a retrospective manner, we discussed the symptom he was having before and he now describes it as a sensation of sudden breathlessness and tightness in the chest that was start at random and go away shortly thereafter.  It was never something that occurred with a certain amount of physical activity.  Since starting low-dose metoprolol he has had very few of these episodes.  Perhaps he was having episodes of PSVT.  We discussed the very elevated blood pressure she had on the treadmill test.  The beta-blocker is also being used to control that.  Coronary calcium score revealed a score of 117.  A statin has been started by his primary care.  Target LDL should be less than 70.  He needs a low carbohydrate diet, weight loss, aerobic activity, and we should consider evaluation for sleep apnea.  Past Medical History:  Diagnosis Date   Anemia    IBS (irritable bowel syndrome)     Past Surgical History:  Procedure Laterality Date   COLONOSCOPY  2014    Current Medications: Current Meds  Medication Sig   clidinium-chlordiazePOXIDE (LIBRAX) 5-2.5 MG capsule Take 1 capsule by mouth 2 (two) times daily.   lansoprazole (PREVACID) 30 MG capsule Take 1 capsule (30 mg total) by mouth daily.   metoprolol succinate (TOPROL-XL) 50 MG 24 hr tablet Take 1 tablet (50 mg total) by mouth daily. Take with or immediately following a meal.    rosuvastatin (CRESTOR) 10 MG tablet Take 1 tablet (10 mg total) by mouth daily.   triamcinolone (KENALOG) 0.1 % APPLY TO AFFECTED AREA TWICE A DAY   [DISCONTINUED] metoprolol succinate (TOPROL XL) 25 MG 24 hr tablet Take 1 tablet (25 mg total) by mouth daily.     Allergies:   Patient has no known allergies.   Social History   Socioeconomic History   Marital status: Divorced    Spouse name: Not on file   Number of children: 2   Years of education: Not on file   Highest education level: Not on file  Occupational History    Employer: [employer]  Tobacco Use   Smoking status: Some Days    Types: E-cigarettes   Smokeless tobacco: Never  Substance and Sexual Activity   Alcohol use: Yes    Comment: occasionally   Drug use: No   Sexual activity: Not on file  Other Topics Concern   Not on file  Social History Narrative   Not on file   Social Determinants of Health   Financial Resource Strain: Not on file  Food Insecurity: Not on file  Transportation Needs: Not on file  Physical Activity: Not on file  Stress: Not on file  Social Connections: Not on file     Family History: The patient's family history includes Colon polyps in his maternal grandmother; Diabetes in his maternal grandmother; Hyperlipidemia in his mother;  Hypertension in his mother; Irritable bowel syndrome in his maternal grandmother and mother; Sickle cell anemia in his father.  ROS:   Please see the history of present illness.    He feels better.  Recurrent chest pain is resolved.  No medication side effects.  All other systems reviewed and are negative.  EKGs/Labs/Other Studies Reviewed:    The following studies were reviewed today:  Exercise treadmill test 12/05/2021: Study Highlights      No ST deviation was noted.   Hypertensive response to exercise. Otherwise normal ECG stress test.   Stress Findings  Resting ECG ECG is normal.  Stress Findings A Bruce protocol stress test was  performed. Exercise capacity was normal. Patient exercised for 7 min and 0 sec. Maximum HR of 151 bpm. MPHR 88.0 %. Peak METS 8.5 .  The patient experienced no angina during the test. The patient achieved the target heart rate. The patient requested the test to be stopped. The patient reported no symptoms during the stress test. Hypertensive blood pressure (234/100 mmHg) and normal heart rate response noted during stress. Heart rate recovery was normal.  Stress ECG No ST deviation was noted. There were no arrhythmias during stress    Coronary calcium score 01/10/2022: FINDINGS: Non-cardiac: See separate report from [facility].   Ascending Aorta: Normal caliber.   Pericardium: Normal.   Coronary arteries: Normal origins.   Coronary Calcium Score:   Left main: 0   Left anterior descending artery: 116   Left circumflex artery: 0   Right coronary artery: 3   Ramus intermedius artery: 0   Total: 119   Percentile: 95th for age, sex, and race matched control.   IMPRESSION: 1. Coronary calcium score of 119. This was 95th percentile for age, gender, and race matched controls   EKG:  EKG not repeated  Recent Labs: 04/02/2022: ALT 24; BUN 20; Creatinine, Ser 1.18; Hemoglobin 16.0; Platelets 286; Potassium 4.3; Sodium 138  Recent Lipid Panel    Component Value Date/Time   CHOL 223 (H) 04/02/2022 0958   TRIG 158 (H) 04/02/2022 0958   HDL 57 04/02/2022 0958   CHOLHDL 3.9 04/02/2022 0958   CHOLHDL 3.6 03/09/2015 1227   VLDL 15 03/09/2015 1227   LDLCALC 138 (H) 04/02/2022 0958    Physical Exam:    VS:  BP 120/84   Pulse 90   Ht '5\' 9"'$  (1.753 m)   Wt 233 lb (105.7 kg)   SpO2 98%   BMI 34.41 kg/m     Wt Readings from Last 3 Encounters:  05/28/22 233 lb (105.7 kg)  04/02/22 232 lb 3.2 oz (105.3 kg)  11/22/21 225 lb 9.6 oz (102.3 kg)     GEN: Obese. No acute distress HEENT: Normal NECK: No JVD. LYMPHATICS: No lymphadenopathy CARDIAC: No murmur. RRR no gallop,  or edema. VASCULAR:  Normal Pulses. No bruits. RESPIRATORY:  Clear to auscultation without rales, wheezing or rhonchi  ABDOMEN: Soft, non-tender, non-distended, No pulsatile mass, MUSCULOSKELETAL: No deformity  SKIN: Warm and dry NEUROLOGIC:  Alert and oriented x 3 PSYCHIATRIC:  Normal affect   ASSESSMENT:    1. Elevated blood-pressure reading without diagnosis of hypertension   2. Hyperlipidemia, unspecified hyperlipidemia type   3. Prediabetes   4. Elevated coronary artery calcium score    PLAN:    In order of problems listed above:  Blood pressure is better.  He does have significant blood pressure elevation with activity.  He is tolerating Toprol-XL 25 mg/day without difficulty.  Plan  to increase metoprolol to 50 mg/day. Target LDL is less than 70.  Primary care started Crestor 10 mg/day.  Needs repeat lipid panel done within the next 6 to 12 weeks to ensure that the target has been achieved and to rule out hepatic inflammation. Low carbohydrate diet, weight loss, and exercise recommended. Primary prevention discussed.  Overall education and awareness concerning primary risk prevention was discussed in detail: LDL less than 70, hemoglobin A1c less than 7, blood pressure target less than 130/80 mmHg, >150 minutes of moderate aerobic activity per week, avoidance of smoking, weight control (via diet and exercise), and continued surveillance/management of/for obstructive sleep apnea.    Medication Adjustments/Labs and Tests Ordered: Current medicines are reviewed at length with the patient today.  Concerns regarding medicines are outlined above.  No orders of the defined types were placed in this encounter.  Meds ordered this encounter  Medications   metoprolol succinate (TOPROL-XL) 50 MG 24 hr tablet    Sig: Take 1 tablet (50 mg total) by mouth daily. Take with or immediately following a meal.    Dispense:  90 tablet    Refill:  3    Dose change.    Patient Instructions   Medication Instructions:  Your physician has recommended you make the following change in your medication:   1) INCREASE metoprolol succinate (Toprol XL) to '50mg'$  daily  *If you need a refill on your cardiac medications before your next appointment, please call your pharmacy*  Lab Work: NONE  Testing/Procedures: NONE  Follow-Up: Follow-up with your primary care provider.  Important Information About Sugar         Signed, Sinclair Grooms, MD  05/28/2022 5:49 PM    [facility]

## 2022-05-28 NOTE — Patient Instructions (Signed)
Medication Instructions:  Your physician has recommended you make the following change in your medication:   1) INCREASE metoprolol succinate (Toprol XL) to '50mg'$  daily  *If you need a refill on your cardiac medications before your next appointment, please call your pharmacy*  Lab Work: NONE  Testing/Procedures: NONE  Follow-Up: Follow-up with your primary care provider.  Important Information About Sugar

## 2022-07-04 ENCOUNTER — Encounter: Payer: Self-pay | Admitting: Family Medicine

## 2022-07-04 ENCOUNTER — Ambulatory Visit: Payer: Federal, State, Local not specified - PPO | Admitting: Family Medicine

## 2022-07-04 VITALS — BP 116/72 | HR 84 | Temp 98.0°F | Wt 235.8 lb

## 2022-07-04 DIAGNOSIS — K582 Mixed irritable bowel syndrome: Secondary | ICD-10-CM

## 2022-07-04 DIAGNOSIS — E785 Hyperlipidemia, unspecified: Secondary | ICD-10-CM | POA: Diagnosis not present

## 2022-07-04 DIAGNOSIS — Z1211 Encounter for screening for malignant neoplasm of colon: Secondary | ICD-10-CM | POA: Diagnosis not present

## 2022-07-04 DIAGNOSIS — K219 Gastro-esophageal reflux disease without esophagitis: Secondary | ICD-10-CM | POA: Diagnosis not present

## 2022-07-04 MED ORDER — CILIDINIUM-CHLORDIAZEPOXIDE 2.5-5 MG PO CAPS: 1.0000 | ORAL_CAPSULE | Freq: Two times a day (BID) | ORAL | 11 refills | Status: DC

## 2022-07-04 MED ORDER — LANSOPRAZOLE 30 MG PO CPDR: 30.0000 mg | DELAYED_RELEASE_CAPSULE | Freq: Every day | ORAL | 3 refills | Status: DC

## 2022-07-04 NOTE — Assessment & Plan Note (Signed)
Repeat lipid panel today.  I am being aggressive given his elevated coronary calcium score.  Goal less than 70.  If LDL is not at goal we will increase Crestor.

## 2022-07-04 NOTE — Assessment & Plan Note (Signed)
Stable.  Continue Pepcid. 

## 2022-07-04 NOTE — Progress Notes (Signed)
Subjective:  Patient ID: Justin [name], male    DOB: 07-Sep-1971  Age: 51 y.o. MRN: [id number]  CC: Chief Complaint  Patient presents with   Irritable Bowel Syndrome    Pt arrives for follow up. Pt states doing well. IBS bouts due to stress of uncle passing away. Pt would like GI Dr.Prytle to do colonoscopy     HPI:  51 year old male with hyperlipidemia, prediabetes, irritable bowel syndrome, GERD presents for follow-up.  He had some recent issues with IBS due to stress of recent death in his family.  He uses Librax which seems to work fairly well for him.  GERD is stable.  He has had some difficulty with his insurance company regarding Prevacid.  Patient was found to have an elevated coronary calcium score which was quite high.  He has underlying hyperlipidemia.  He is on Crestor 10 mg daily and tolerating.  Needs repeat lipid panel.  Has seen cardiology.  Cardiology has placed him on metoprolol as well.  Patient needs repeat colonoscopy.  Would like to see GI physician Dr. Hilarie Fredrickson.  Patient Active Problem List   Diagnosis Date Noted   Annual physical exam 04/02/2022   Elevated coronary artery calcium score 04/02/2022   Gastroesophageal reflux disease without esophagitis 09/15/2021   Hyperlipidemia 09/13/2021   Prediabetes 09/13/2021   Irritable bowel syndrome with both constipation and diarrhea 09/17/2012    Social Hx   Social History   Socioeconomic History   Marital status: Divorced    Spouse name: Not on file   Number of children: 2   Years of education: Not on file   Highest education level: Not on file  Occupational History    Employer: [employer]  Tobacco Use   Smoking status: Some Days    Types: E-cigarettes   Smokeless tobacco: Never  Substance and Sexual Activity   Alcohol use: Yes    Comment: occasionally   Drug use: No   Sexual activity: Not on file  Other Topics Concern   Not on file  Social History Narrative   Not on file    Social Determinants of Health   Financial Resource Strain: Not on file  Food Insecurity: Not on file  Transportation Needs: Not on file  Physical Activity: Not on file  Stress: Not on file  Social Connections: Not on file    Review of Systems  Constitutional: Negative.   Gastrointestinal:  Positive for abdominal pain, constipation and diarrhea.   Objective:  BP 116/72   Pulse 84   Temp 98 F (36.7 C)   Wt 235 lb 12.8 oz (107 kg)   SpO2 97%   BMI 34.82 kg/m      07/04/2022    9:49 AM 05/28/2022    4:18 PM 04/02/2022    9:32 AM  BP/Weight  Systolic BP 703 500 938  Diastolic BP 72 84 72  Wt. (Lbs) 235.8 233   BMI 34.82 kg/m2 34.41 kg/m2     Physical Exam Constitutional:      General: He is not in acute distress.    Appearance: Normal appearance.  HENT:     Head: Normocephalic and atraumatic.  Eyes:     General:        Right eye: No discharge.        Left eye: No discharge.     Conjunctiva/sclera: Conjunctivae normal.  Cardiovascular:     Rate and Rhythm: Normal rate and regular rhythm.  Pulmonary:  Effort: Pulmonary effort is normal.     Breath sounds: Normal breath sounds.  Abdominal:     General: There is no distension.     Palpations: Abdomen is soft.     Tenderness: There is no abdominal tenderness.  Neurological:     Mental Status: He is alert.  Psychiatric:        Mood and Affect: Mood normal.        Behavior: Behavior normal.     Lab Results  Component Value Date   WBC 6.5 04/02/2022   HGB 16.0 04/02/2022   HCT 47.4 04/02/2022   PLT 286 04/02/2022   GLUCOSE 148 (H) 04/02/2022   CHOL 223 (H) 04/02/2022   TRIG 158 (H) 04/02/2022   HDL 57 04/02/2022   LDLCALC 138 (H) 04/02/2022   ALT 24 04/02/2022   AST 15 04/02/2022   NA 138 04/02/2022   K 4.3 04/02/2022   CL 101 04/02/2022   CREATININE 1.18 04/02/2022   BUN 20 04/02/2022   CO2 19 (L) 04/02/2022   PSA 0.69 03/09/2015   HGBA1C 6.1 (H) 04/02/2022     Assessment & Plan:    Problem List Items Addressed This Visit       Digestive   Gastroesophageal reflux disease without esophagitis    Stable.  Continue Pepcid.      Relevant Medications   clidinium-chlordiazePOXIDE (LIBRAX) 5-2.5 MG capsule   lansoprazole (PREVACID) 30 MG capsule   Irritable bowel syndrome with both constipation and diarrhea - Primary    Patient is doing fairly well.  Has had some recent difficulty.  Continue Librax.      Relevant Medications   clidinium-chlordiazePOXIDE (LIBRAX) 5-2.5 MG capsule   lansoprazole (PREVACID) 30 MG capsule   Other Relevant Orders   Ambulatory referral to Gastroenterology     Other   Hyperlipidemia    Repeat lipid panel today.  I am being aggressive given his elevated coronary calcium score.  Goal less than 70.  If LDL is not at goal we will increase Crestor.      Relevant Orders   Lipid panel   Other Visit Diagnoses     Encounter for screening colonoscopy       Relevant Orders   Ambulatory referral to Gastroenterology       Meds ordered this encounter  Medications   clidinium-chlordiazePOXIDE (LIBRAX) 5-2.5 MG capsule    Sig: Take 1 capsule by mouth 2 (two) times daily.    Dispense:  60 capsule    Refill:  11   lansoprazole (PREVACID) 30 MG capsule    Sig: Take 1 capsule (30 mg total) by mouth daily.    Dispense:  90 capsule    Refill:  3    Follow-up: 6 months  [place]

## 2022-07-04 NOTE — Patient Instructions (Signed)
Lipid panel today.  Continue your medications.  Follow up in 6 months.

## 2022-07-04 NOTE — Assessment & Plan Note (Signed)
Patient is doing fairly well.  Has had some recent difficulty.  Continue Librax.

## 2022-07-05 LAB — LIPID PANEL
Chol/HDL Ratio: 3.6 ratio (ref 0.0–5.0)
Cholesterol, Total: 198 mg/dL (ref 100–199)
HDL: 55 mg/dL (ref >39)
LDL Chol Calc (NIH): 122 mg/dL — ABNORMAL HIGH (ref 0–99)
Triglycerides: 119 mg/dL (ref 0–149)
VLDL Cholesterol Cal: 21 mg/dL (ref 5–40)

## 2022-07-24 ENCOUNTER — Other Ambulatory Visit: Payer: Self-pay

## 2022-07-24 DIAGNOSIS — E785 Hyperlipidemia, unspecified: Secondary | ICD-10-CM

## 2022-07-24 MED ORDER — ROSUVASTATIN CALCIUM 40 MG PO TABS: ORAL_TABLET | ORAL | 1 refills | Status: DC

## 2022-08-27 ENCOUNTER — Other Ambulatory Visit: Payer: Self-pay | Admitting: Family Medicine

## 2022-08-27 DIAGNOSIS — K582 Mixed irritable bowel syndrome: Secondary | ICD-10-CM

## 2022-10-09 ENCOUNTER — Encounter (INDEPENDENT_AMBULATORY_CARE_PROVIDER_SITE_OTHER): Payer: Self-pay | Admitting: *Deleted

## 2022-10-28 ENCOUNTER — Encounter: Payer: Self-pay | Admitting: Physician Assistant

## 2022-11-07 ENCOUNTER — Ambulatory Visit: Payer: Federal, State, Local not specified - PPO | Admitting: Podiatry

## 2022-11-17 ENCOUNTER — Encounter: Payer: Self-pay | Admitting: Podiatry

## 2022-11-17 ENCOUNTER — Ambulatory Visit: Payer: Federal, State, Local not specified - PPO | Admitting: Podiatry

## 2022-11-17 DIAGNOSIS — M722 Plantar fascial fibromatosis: Secondary | ICD-10-CM

## 2022-11-17 NOTE — Progress Notes (Signed)
Subjective:   Patient ID: Justin [name], male   DOB: 52 y.o.   MRN: [id number]   HPI Patient presents stating he is continuing does get discomfort in his arches and heels and he needs new orthotics as his other pair from 2 years ago were torn   ROS      Objective:  Physical Exam  Neurovascular status intact with chronic discomfort in the plantar fascia controlled well with orthotics with last pair which have broken down     Assessment:  Chronic fasciitis bilateral     Plan:  H&P reviewed new orthotics to be made from custom) for his feet to stabilize the arch and recommended redoing the old pair.  Patient will be seen back to recheck all questions answered today and scheduled for return when orthotics return

## 2022-12-02 ENCOUNTER — Ambulatory Visit: Payer: Federal, State, Local not specified - PPO | Admitting: Physician Assistant

## 2022-12-02 ENCOUNTER — Encounter: Payer: Self-pay | Admitting: Physician Assistant

## 2022-12-02 VITALS — BP 132/90 | HR 83 | Ht 69.0 in | Wt 227.0 lb

## 2022-12-02 DIAGNOSIS — Z86010 Personal history of colonic polyps: Secondary | ICD-10-CM | POA: Diagnosis not present

## 2022-12-02 DIAGNOSIS — K582 Mixed irritable bowel syndrome: Secondary | ICD-10-CM

## 2022-12-02 DIAGNOSIS — R7989 Other specified abnormal findings of blood chemistry: Secondary | ICD-10-CM | POA: Diagnosis not present

## 2022-12-02 DIAGNOSIS — K219 Gastro-esophageal reflux disease without esophagitis: Secondary | ICD-10-CM

## 2022-12-02 MED ORDER — PLENVU 140 G PO SOLR: 1.0000 | Freq: Once | ORAL | 0 refills | Status: AC

## 2022-12-02 NOTE — Patient Instructions (Addendum)
_______________________________________________________  If your blood pressure at your visit was 140/90 or greater, please contact your primary care physician to follow up on this.  _______________________________________________________  If you are age 51 or older, your body mass index should be between 23-30. Your Body mass index is 33.52 kg/m. If this is out of the aforementioned range listed, please consider follow up with your Primary Care Provider.  If you are age 44 or younger, your body mass index should be between 19-25. Your Body mass index is 33.52 kg/m. If this is out of the aformentioned range listed, please consider follow up with your Primary Care Provider.   ________________________________________________________  The [facility] providers would like to encourage you to use [facility] to communicate with providers for non-urgent requests or questions.  Due to long hold times on the telephone, sending your provider a message by [facility] may be a faster and more efficient way to get a response.  Please allow 48 business hours for a response.  Please remember that this is for non-urgent requests.  _______________________________________________________  Justin [name] have been scheduled for a colonoscopy. Please follow written instructions given to you at your visit today.  Please pick up your prep supplies at the pharmacy within the next 1-3 days. If you use inhalers (even only as needed), please bring them with you on the day of your procedure.   First do a trial off milk/lactose products if you use them.  Add fiber like benefiber or citracel once a day for a month Increase activity Can do trial of IBGard which is over the counter for AB pain- Take 1-2 capsules once a day for maintence or twice a day during a flare Librax can continue twice day.  Please try to decrease stress. consider talking with PCP about anti anxiety medication like zolofrt/prozac or try head space app for  meditation.  Please try low FODMAP diet- see below- start with eliminating just one column at a time, the table at the very bottom contains foods that are safe to take   FODMAP stands for fermentable oligo-, di-, mono-saccharides and polyols (1). These are the scientific terms used to classify groups of carbs that are notorious for triggering digestive symptoms like bloating, gas and stomach pain.

## 2022-12-02 NOTE — Progress Notes (Signed)
12/02/2022 Rolene Course [id number] [birth year]  Referring provider: Coral Spikes, DO Primary GI doctor: Dr. Hilarie Fredrickson  ASSESSMENT AND PLAN:   Irritable bowel syndrome with both constipation and diarrhea Difficult to treat with mixed type Does well with librax from PCP, can add on IBGARD and discussed FODMAP Consider SSRI with PCP Add on fiber Due for colonoscopy, will schedule  Gastroesophageal reflux disease without esophagitis Lifestyle changes discussed, avoid NSAIDS, ETOH  Elevated LFTs Has had mild Alk phos elevation, some AST elevation.  Most likely this is fatty liver, will get RUQ [place] to evaluate Counseled on stopping ETOH Consider fractionated alk phos if continues  History of colonic polyps 10/11/2012 colonoscopy with Dr. Hilarie Fredrickson for rectal bleeding with IDA showed 3 diminutive hyperplastic sessile polyps 2 to 3 mm in size in the rectum otherwise unremarkable, small internal hemorrhoids recall 09/2022. We have discussed the risks of bleeding, infection, perforation, medication reactions, and remote risk of death associated with colonoscopy. All questions were answered and the patient acknowledges these risk and wishes to proceed.   Patient Care Team: Coral Spikes, DO as PCP - General (Family Medicine)  HISTORY OF PRESENT ILLNESS: 52 y.o. male with a past medical history of elevated LFTs, IBS, hyperlipidemia, hypertension, prediabetes and others listed below presents for evaluation of IBS and screening colonoscopy.   2013 seen in the office as a new patient for rectal bleeding presumed to be due to fissure, had iron deficiency at the time with iron 25. 10/11/2012 colonoscopy with Dr. Hilarie Fredrickson for rectal bleeding with IDA showed 3 diminutive hyperplastic sessile polyps 2 to 3 mm in size in the rectum otherwise unremarkable, small internal hemorrhoids recall 09/2022.  Patient states he has had IBS for 30+ years, worse if he does not eat well. Stress is worse.  Will  have AB pain, can either have constipation for a few days or have diarrhea.  States he has more constipation than diarrhea but normally 50/50.  Has less diarrhea with eating more salads, less red meat, exercising.  Has lower AB pain cramping, worse before BM, better after BM.  Takes librax 2 pills once daily, this helps with stress too. Had dizziness with levsin.  Will take olive oil to help with BM.  He does not do much dairy.   Patient's had elevated LFTs since at least 3 years ago predominantly mild alk phos.   09/13/2021 alk phos 127, AST 32, ALT 46 04/02/2022 alk phos 128, AST 15, ALT 24. Did have negative GGT, HIV and hepatitis C antibody negative He will drink ETOH on the weekends, 2 shots and 2 beers.  He does not smoke, no drug use.  No family history of liver issues.  No nausea, vomiting. Denies pruritus. Denies swelling legs or AB. Denies history of jaundice.   He has GERD with certain foods, will take pepcid as needed.  Denies dysphagia, melena.  Rare bASA, but no tylenol, no NSAIDS.  12/05/2021 exercise tolerance test EKG stress test, hypertensive response to exercise.  He  reports that he has been smoking e-cigarettes. He has never used smokeless tobacco. He reports current alcohol use. He reports that he does not use drugs.  RELEVANT LABS AND IMAGING: CBC    Component Value Date/Time   WBC 6.5 04/02/2022 0958   WBC 6.3 09/14/2012 1627   RBC 5.61 04/02/2022 0958   RBC 4.75 09/14/2012 1627   HGB 16.0 04/02/2022 0958   HCT 47.4 04/02/2022 0958   PLT 286 04/02/2022  0958   MCV 85 04/02/2022 0958   MCH 28.5 04/02/2022 0958   MCH 25.5 (L) 09/14/2012 1627   MCHC 33.8 04/02/2022 0958   MCHC 34.4 09/14/2012 1627   RDW 15.2 04/02/2022 0958   LYMPHSABS 2.1 11/02/2020 1439   MONOABS 0.6 09/14/2012 1627   EOSABS 0.1 11/02/2020 1439   BASOSABS 0.0 11/02/2020 1439   Recent Labs    04/02/22 0958  HGB 16.0     CMP     Component Value Date/Time   NA 138 04/02/2022  0958   K 4.3 04/02/2022 0958   CL 101 04/02/2022 0958   CO2 19 (L) 04/02/2022 0958   GLUCOSE 148 (H) 04/02/2022 0958   GLUCOSE 91 03/09/2015 1227   BUN 20 04/02/2022 0958   CREATININE 1.18 04/02/2022 0958   CREATININE 1.10 03/09/2015 1227   CALCIUM 9.7 04/02/2022 0958   PROT 8.1 04/02/2022 0958   ALBUMIN 4.6 04/02/2022 0958   AST 15 04/02/2022 0958   ALT 24 04/02/2022 0958   ALKPHOS 128 (H) 04/02/2022 0958   BILITOT 0.6 04/02/2022 0958   GFRNONAA 68 11/02/2020 1439   GFRAA 78 11/02/2020 1439      Latest Ref Rng & Units 04/02/2022    9:58 AM 09/13/2021    3:25 PM 04/04/2021    2:02 PM  Hepatic Function  Total Protein 6.0 - 8.5 g/dL 8.1  8.2  7.8   Albumin 4.0 - 5.0 g/dL 4.6  4.5  4.6   AST 0 - 40 IU/L 15  32  37   ALT 0 - 44 IU/L 24  46  43   Alk Phosphatase 44 - 121 IU/L 128  127  121   Total Bilirubin 0.0 - 1.2 mg/dL 0.6  0.4  0.3       Current Medications:    Current Outpatient Medications (Cardiovascular):    metoprolol succinate (TOPROL-XL) 50 MG 24 hr tablet, Take 1 tablet (50 mg total) by mouth daily. Take with or immediately following a meal.   rosuvastatin (CRESTOR) 40 MG tablet, Take one tablet po daily     Current Outpatient Medications (Other):    clidinium-chlordiazePOXIDE (LIBRAX) 5-2.5 MG capsule, Take 1 capsule by mouth 2 (two) times daily.   lansoprazole (PREVACID) 30 MG capsule, Take 1 capsule (30 mg total) by mouth daily.  Medical History:  Past Medical History:  Diagnosis Date   Anemia    IBS (irritable bowel syndrome)    Allergies: No Known Allergies   Surgical History:  He  has a past surgical history that includes Colonoscopy (2014). Family History:  His family history includes Colon polyps in his maternal grandmother; Diabetes in his maternal grandmother; Hyperlipidemia in his mother; Hypertension in his mother; Irritable bowel syndrome in his maternal grandmother and mother; Sickle cell anemia in his father.  REVIEW OF SYSTEMS  : All  other systems reviewed and negative except where noted in the History of Present Illness.  PHYSICAL EXAM: BP (!) 132/90   Pulse 83   Ht '5\' 9"'$  (1.753 m)   Wt 227 lb (103 kg)   BMI 33.52 kg/m  General Appearance: Well nourished, in no apparent distress. Head:   Normocephalic and atraumatic. Eyes:  sclerae anicteric,conjunctive pink  Respiratory: Respiratory effort normal, BS equal bilaterally without rales, rhonchi, wheezing. Cardio: RRR with no MRGs. Peripheral pulses intact.  Abdomen: Soft,  Obese ,active bowel sounds. mild tenderness in the lower abdomen. Without guarding and Without rebound. No masses. Rectal: Not evaluated Musculoskeletal: Full ROM,  Normal gait. Without edema. Skin:  Dry and intact without significant lesions or rashes Neuro: Alert and  oriented x4;  No focal deficits. Psych:  Cooperative. Normal mood and affect.    Vladimir Crofts, PA-C 3:00 PM

## 2022-12-05 ENCOUNTER — Other Ambulatory Visit: Payer: Self-pay | Admitting: Family Medicine

## 2022-12-05 DIAGNOSIS — E785 Hyperlipidemia, unspecified: Secondary | ICD-10-CM

## 2022-12-09 NOTE — Progress Notes (Signed)
Addendum: Reviewed and agree with assessment and management plan. Margerite Impastato M, MD  

## 2022-12-19 ENCOUNTER — Ambulatory Visit (HOSPITAL_COMMUNITY)
Admission: RE | Admit: 2022-12-19 | Discharge: 2022-12-19 | Disposition: A | Discharge status: Home / Self Care | Payer: Federal, State, Local not specified - PPO | Source: Ambulatory Visit | Attending: Physician Assistant | Admitting: Physician Assistant

## 2022-12-19 DIAGNOSIS — R945 Abnormal results of liver function studies: Secondary | ICD-10-CM | POA: Diagnosis not present

## 2022-12-19 DIAGNOSIS — R7989 Other specified abnormal findings of blood chemistry: Secondary | ICD-10-CM | POA: Insufficient documentation

## 2023-01-08 ENCOUNTER — Ambulatory Visit (INDEPENDENT_AMBULATORY_CARE_PROVIDER_SITE_OTHER): Payer: Federal, State, Local not specified - PPO | Admitting: Family Medicine

## 2023-01-08 ENCOUNTER — Telehealth: Payer: Self-pay | Admitting: Family Medicine

## 2023-01-08 VITALS — BP 130/82 | Ht 69.0 in | Wt 229.4 lb

## 2023-01-08 DIAGNOSIS — K219 Gastro-esophageal reflux disease without esophagitis: Secondary | ICD-10-CM

## 2023-01-08 DIAGNOSIS — Z130 Encounter for screening for diseases of the blood and blood-forming organs and certain disorders involving the immune mechanism: Secondary | ICD-10-CM | POA: Diagnosis not present

## 2023-01-08 DIAGNOSIS — E785 Hyperlipidemia, unspecified: Secondary | ICD-10-CM

## 2023-01-08 DIAGNOSIS — K582 Mixed irritable bowel syndrome: Secondary | ICD-10-CM | POA: Diagnosis not present

## 2023-01-08 DIAGNOSIS — R7303 Prediabetes: Secondary | ICD-10-CM | POA: Diagnosis not present

## 2023-01-08 DIAGNOSIS — M79673 Pain in unspecified foot: Secondary | ICD-10-CM

## 2023-01-08 MED ORDER — METOPROLOL SUCCINATE ER 50 MG PO TB24: 50.0000 mg | ORAL_TABLET | Freq: Every day | ORAL | 3 refills | Status: DC

## 2023-01-08 MED ORDER — CILIDINIUM-CHLORDIAZEPOXIDE 2.5-5 MG PO CAPS: 1.0000 | ORAL_CAPSULE | Freq: Two times a day (BID) | ORAL | 3 refills | Status: DC

## 2023-01-08 MED ORDER — LANSOPRAZOLE 30 MG PO CPDR: 30.0000 mg | DELAYED_RELEASE_CAPSULE | Freq: Every day | ORAL | 3 refills | Status: DC

## 2023-01-08 NOTE — Patient Instructions (Signed)
Labs today.  Follow up in 6 months. 

## 2023-01-08 NOTE — Telephone Encounter (Signed)
Patient brought in FMLA be be completed, He has this done every year. Attached is old form.

## 2023-01-09 LAB — CMP14+EGFR
ALT: 31 IU/L (ref 0–44)
AST: 22 IU/L (ref 0–40)
Albumin/Globulin Ratio: 1.4 (ref 1.2–2.2)
Albumin: 4.7 g/dL (ref 3.8–4.9)
Alkaline Phosphatase: 131 IU/L — ABNORMAL HIGH (ref 44–121)
BUN/Creatinine Ratio: 11 (ref 9–20)
BUN: 13 mg/dL (ref 6–24)
Bilirubin Total: 0.6 mg/dL (ref 0.0–1.2)
CO2: 22 mmol/L (ref 20–29)
Calcium: 9.8 mg/dL (ref 8.7–10.2)
Chloride: 101 mmol/L (ref 96–106)
Creatinine, Ser: 1.14 mg/dL (ref 0.76–1.27)
Globulin, Total: 3.4 g/dL (ref 1.5–4.5)
Glucose: 98 mg/dL (ref 70–99)
Potassium: 4.1 mmol/L (ref 3.5–5.2)
Sodium: 139 mmol/L (ref 134–144)
Total Protein: 8.1 g/dL (ref 6.0–8.5)
eGFR: 78 mL/min/{1.73_m2} (ref >59)

## 2023-01-09 LAB — CBC
Hematocrit: 48.3 % (ref 37.5–51.0)
Hemoglobin: 16 g/dL (ref 13.0–17.7)
MCH: 28.2 pg (ref 26.6–33.0)
MCHC: 33.1 g/dL (ref 31.5–35.7)
MCV: 85 fL (ref 79–97)
Platelets: 286 10*3/uL (ref 150–450)
RBC: 5.68 x10E6/uL (ref 4.14–5.80)
RDW: 15.7 % — ABNORMAL HIGH (ref 11.6–15.4)
WBC: 6.4 10*3/uL (ref 3.4–10.8)

## 2023-01-09 LAB — LIPID PANEL
Chol/HDL Ratio: 4.5 ratio (ref 0.0–5.0)
Cholesterol, Total: 246 mg/dL — ABNORMAL HIGH (ref 100–199)
HDL: 55 mg/dL (ref >39)
LDL Chol Calc (NIH): 176 mg/dL — ABNORMAL HIGH (ref 0–99)
Triglycerides: 89 mg/dL (ref 0–149)
VLDL Cholesterol Cal: 15 mg/dL (ref 5–40)

## 2023-01-09 LAB — HEMOGLOBIN A1C
Est. average glucose Bld gHb Est-mCnc: 131 mg/dL
Hgb A1c MFr Bld: 6.2 % — ABNORMAL HIGH (ref 4.8–5.6)

## 2023-01-09 NOTE — Progress Notes (Signed)
Subjective:  Patient ID: Justin [name], male    DOB: [birth year]  Age: 52 y.o. MRN: [id number]  CC: Chief Complaint  Patient presents with   Irritable Bowel Syndrome    Follow up    HPI:  52 year old male with underlying CAD (elevated coronary calcium score), IBS, GERD, HLD presents for follow up.  IBS is stable on Librax. Sees GI as well. GI has suggested IBgard and possible SSRI.  Needs annual FMLA renewed. Has colonoscopy scheduled.   Needs lipid panel to assess lipids. Currently on Crestor 40 mg but has cut back to 1/2 due to low libido.   GERD stable on Prevacid.    Patient Active Problem List   Diagnosis Date Noted   Annual physical exam 04/02/2022   Elevated coronary artery calcium score 04/02/2022   Gastroesophageal reflux disease without esophagitis 09/15/2021   Hyperlipidemia 09/13/2021   Prediabetes 09/13/2021   Irritable bowel syndrome with both constipation and diarrhea 09/17/2012    Social Hx   Social History   Socioeconomic History   Marital status: Divorced    Spouse name: Not on file   Number of children: 2   Years of education: Not on file   Highest education level: Not on file  Occupational History    Employer: [employer]  Tobacco Use   Smoking status: Some Days    Types: E-cigarettes   Smokeless tobacco: Never  Vaping Use   Vaping Use: Never used  Substance and Sexual Activity   Alcohol use: Yes    Comment: occasionally   Drug use: No   Sexual activity: Not on file  Other Topics Concern   Not on file  Social History Narrative   Not on file   Social Determinants of Health   Financial Resource Strain: Not on file  Food Insecurity: Not on file  Transportation Needs: Not on file  Physical Activity: Not on file  Stress: Not on file  Social Connections: Not on file    Review of Systems  Constitutional: Negative.   Respiratory: Negative.    Cardiovascular: Negative.   Gastrointestinal:  Positive for  constipation and diarrhea.     Objective:  BP 130/82   Ht 5\' 9"  (1.753 m)   Wt 229 lb 6.4 oz (104.1 kg)   BMI 33.88 kg/m      01/08/2023    3:04 PM 12/02/2022    2:35 PM 07/04/2022    9:49 AM  BP/Weight  Systolic BP 130 132 116  Diastolic BP 82 90 72  Wt. (Lbs) 229.4 227 235.8  BMI 33.88 kg/m2 33.52 kg/m2 34.82 kg/m2    Physical Exam Vitals and nursing note reviewed.  Constitutional:      General: He is not in acute distress.    Appearance: Normal appearance.  HENT:     Head: Normocephalic and atraumatic.  Eyes:     General:        Right eye: No discharge.        Left eye: No discharge.     Conjunctiva/sclera: Conjunctivae normal.  Cardiovascular:     Rate and Rhythm: Normal rate and regular rhythm.  Pulmonary:     Effort: Pulmonary effort is normal.     Breath sounds: Normal breath sounds. No wheezing, rhonchi or rales.  Neurological:     Mental Status: He is alert.  Psychiatric:        Mood and Affect: Mood normal.        Behavior: Behavior  normal.     Lab Results  Component Value Date   WBC 6.4 01/08/2023   HGB 16.0 01/08/2023   HCT 48.3 01/08/2023   PLT 286 01/08/2023   GLUCOSE 98 01/08/2023   CHOL 246 (H) 01/08/2023   TRIG 89 01/08/2023   HDL 55 01/08/2023   LDLCALC 176 (H) 01/08/2023   ALT 31 01/08/2023   AST 22 01/08/2023   NA 139 01/08/2023   K 4.1 01/08/2023   CL 101 01/08/2023   CREATININE 1.14 01/08/2023   BUN 13 01/08/2023   CO2 22 01/08/2023   PSA 0.69 03/09/2015   HGBA1C 6.2 (H) 01/08/2023     Assessment & Plan:   Problem List Items Addressed This Visit       Digestive   Irritable bowel syndrome with both constipation and diarrhea - Primary    Continue current medication. Refilled today. FMLA form filled out as well.       Relevant Medications   clidinium-chlordiazePOXIDE (LIBRAX) 5-2.5 MG capsule   lansoprazole (PREVACID) 30 MG capsule   Gastroesophageal reflux disease without esophagitis    Stable. Continue Prevacid.       Relevant Medications   clidinium-chlordiazePOXIDE (LIBRAX) 5-2.5 MG capsule   lansoprazole (PREVACID) 30 MG capsule     Other   Prediabetes   Relevant Orders   CMP14+EGFR (Completed)   Hemoglobin A1c (Completed)   Hyperlipidemia    Lipids uncontrolled. Will see if we can get Repatha approved.      Relevant Medications   metoprolol succinate (TOPROL-XL) 50 MG 24 hr tablet   Other Relevant Orders   Lipid panel (Completed)   Other Visit Diagnoses     Screening for deficiency anemia       Relevant Orders   CBC (Completed)       Meds ordered this encounter  Medications   clidinium-chlordiazePOXIDE (LIBRAX) 5-2.5 MG capsule    Sig: Take 1 capsule by mouth 2 (two) times daily.    Dispense:  180 capsule    Refill:  3   lansoprazole (PREVACID) 30 MG capsule    Sig: Take 1 capsule (30 mg total) by mouth daily.    Dispense:  90 capsule    Refill:  3   metoprolol succinate (TOPROL-XL) 50 MG 24 hr tablet    Sig: Take 1 tablet (50 mg total) by mouth daily. Take with or immediately following a meal.    Dispense:  90 tablet    Refill:  3    Dose change.    Follow-up:  6 months  Tambra Muller Adriana Simas DO [facility]

## 2023-01-09 NOTE — Assessment & Plan Note (Signed)
Stable.  Continue Prevacid. 

## 2023-01-09 NOTE — Assessment & Plan Note (Signed)
Continue current medication. Refilled today. FMLA form filled out as well.

## 2023-01-09 NOTE — Assessment & Plan Note (Signed)
Lipids uncontrolled. Will see if we can get Repatha approved.

## 2023-01-15 ENCOUNTER — Encounter: Payer: Self-pay | Admitting: Internal Medicine

## 2023-01-19 ENCOUNTER — Encounter: Payer: Self-pay | Admitting: *Deleted

## 2023-01-23 ENCOUNTER — Ambulatory Visit (AMBULATORY_SURGERY_CENTER): Payer: Federal, State, Local not specified - PPO | Admitting: Internal Medicine

## 2023-01-23 ENCOUNTER — Encounter: Payer: Self-pay | Admitting: Internal Medicine

## 2023-01-23 VITALS — BP 133/83 | HR 65 | Temp 97.4°F | Resp 15 | Ht 69.0 in | Wt 227.0 lb

## 2023-01-23 DIAGNOSIS — Z1211 Encounter for screening for malignant neoplasm of colon: Secondary | ICD-10-CM

## 2023-01-23 MED ORDER — SODIUM CHLORIDE 0.9 % IV SOLN: 500.0000 mL | Freq: Once | INTRAVENOUS | Status: DC

## 2023-01-23 NOTE — Op Note (Signed)
[facility] Patient Name: Justin [name] Procedure Date: 01/23/2023 3:22 PM MRN: [id number] Endoscopist: Beverley Fiedler , MD, [id number] Age: 52 Referring MD:  Date of Birth: Feb 08, 1971 Gender: Male Account #: [id number] Procedure:                Colonoscopy Indications:              Screening for colorectal malignant neoplasm, Last                            colonoscopy 10 years ago Medicines:                Monitored Anesthesia Care; propofol 330 mg IV Procedure:                Pre-Anesthesia Assessment:                           - Prior to the procedure, a History and Physical                            was performed, and patient medications and                            allergies were reviewed. The patient's tolerance of                            previous anesthesia was also reviewed. The risks                            and benefits of the procedure and the sedation                            options and risks were discussed with the patient.                            All questions were answered, and informed consent                            was obtained. Prior Anticoagulants: The patient has                            taken no anticoagulant or antiplatelet agents. ASA                            Grade Assessment: II - A patient with mild systemic                            disease. After reviewing the risks and benefits,                            the patient was deemed in satisfactory condition to                            undergo the procedure.  After obtaining informed consent, the colonoscope                            was passed under direct vision. Throughout the                            procedure, the patient's blood pressure, pulse, and                            oxygen saturations were monitored continuously. The                            CF [id number] #[id number] was introduced through the anus                            and advanced  to the cecum, identified by the                            appendiceal orifice. The colonoscopy was performed                            without difficulty. The patient tolerated the                            procedure well. The quality of the bowel                            preparation was good. The ileocecal valve,                            appendiceal orifice, and rectum were photographed. Scope In: 3:33:49 PM Scope Out: 3:49:55 PM Scope Withdrawal Time: 0 hours 9 minutes 5 seconds  Total Procedure Duration: 0 hours 16 minutes 6 seconds  Findings:                 The digital rectal exam was normal.                           The entire examined colon appeared normal on direct                            and retroflexion views. Complications:            No immediate complications. Estimated Blood Loss:     Estimated blood loss: none. Impression:               - The entire examined colon is normal on direct and                            retroflexion views.                           - No specimens collected. Recommendation:           - Patient has a contact number available for  emergencies. The signs and symptoms of potential                            delayed complications were discussed with the                            patient. Return to normal activities tomorrow.                            Written discharge instructions were provided to the                            patient.                           - Resume previous diet.                           - Continue present medications.                           - Repeat colonoscopy in 10 years for screening                            purposes. Beverley Fiedler, MD 01/23/2023 3:53:30 PM This report has been signed electronically.

## 2023-01-23 NOTE — Progress Notes (Unsigned)
GASTROENTEROLOGY PROCEDURE H&P NOTE   Primary Care Physician: Tommie Sams, DO    Reason for Procedure:  Colon cancer screening  Plan:    Colonoscopy  Patient is appropriate for endoscopic procedure(s) in the ambulatory ([facility]) setting.  The nature of the procedure, as well as the risks, benefits, and alternatives were carefully and thoroughly reviewed with the patient. Ample time for discussion and questions allowed. The patient understood, was satisfied, and agreed to proceed.     HPI: Justin [name] is a 52 y.o. male who presents for colonoscopy.  Medical history as below.  Tolerated the prep.  No recent chest pain or shortness of breath.  No abdominal pain today.  Past Medical History:  Diagnosis Date   Anemia    IBS (irritable bowel syndrome)     Past Surgical History:  Procedure Laterality Date   COLONOSCOPY  2014    Prior to Admission medications   Medication Sig Start Date End Date Taking? Authorizing Provider  clidinium-chlordiazePOXIDE (LIBRAX) 5-2.5 MG capsule Take 1 capsule by mouth 2 (two) times daily. 01/08/23  Yes Cook, Jayce G, DO  lansoprazole (PREVACID) 30 MG capsule Take 1 capsule (30 mg total) by mouth daily. 01/08/23  Yes Cook, Jayce G, DO  metoprolol succinate (TOPROL-XL) 50 MG 24 hr tablet Take 1 tablet (50 mg total) by mouth daily. Take with or immediately following a meal. 01/08/23  Yes Cook, Jayce G, DO  rosuvastatin (CRESTOR) 40 MG tablet TAKE 1 TABLET BY MOUTH EVERY DAY 12/08/22  Yes Tommie Sams, DO    Current Outpatient Medications  Medication Sig Dispense Refill   clidinium-chlordiazePOXIDE (LIBRAX) 5-2.5 MG capsule Take 1 capsule by mouth 2 (two) times daily. 180 capsule 3   lansoprazole (PREVACID) 30 MG capsule Take 1 capsule (30 mg total) by mouth daily. 90 capsule 3   metoprolol succinate (TOPROL-XL) 50 MG 24 hr tablet Take 1 tablet (50 mg total) by mouth daily. Take with or immediately following a meal. 90 tablet 3   rosuvastatin  (CRESTOR) 40 MG tablet TAKE 1 TABLET BY MOUTH EVERY DAY 90 tablet 1   Current Facility-Administered Medications  Medication Dose Route Frequency Provider Last Rate Last Admin   0.9 %  sodium chloride infusion  500 mL Intravenous Once Yifan Auker, Carie Caddy, MD        Allergies as of 01/23/2023   (No Known Allergies)    Family History  Problem Relation Age of Onset   Diabetes Maternal Grandmother    Irritable bowel syndrome Maternal Grandmother    Colon polyps Maternal Grandmother    Irritable bowel syndrome Mother    Hypertension Mother    Hyperlipidemia Mother    Sickle cell anemia Father     Social History   Socioeconomic History   Marital status: Divorced    Spouse name: Not on file   Number of children: 2   Years of education: Not on file   Highest education level: Not on file  Occupational History    Employer: [employer]  Tobacco Use   Smoking status: Some Days    Types: E-cigarettes   Smokeless tobacco: Never  Vaping Use   Vaping Use: Former  Substance and Sexual Activity   Alcohol use: Yes    Comment: occasionally   Drug use: No   Sexual activity: Not on file  Other Topics Concern   Not on file  Social History Narrative   Not on file   Social Determinants of Health  Financial Resource Strain: Not on file  Food Insecurity: Not on file  Transportation Needs: Not on file  Physical Activity: Not on file  Stress: Not on file  Social Connections: Not on file  Intimate Partner Violence: Not on file    Physical Exam: Vital signs in last 24 hours: @BP  (!) 128/93   Pulse 78   Temp (!) 97.4 F (36.3 C) (Temporal)   Resp 15   Ht 5\' 9"  (1.753 m)   Wt 227 lb (103 kg)   SpO2 99%   BMI 33.52 kg/m  GEN: NAD EYE: Sclerae anicteric ENT: MMM CV: Non-tachycardic Pulm: CTA b/l GI: Soft, NT/ND NEURO:  Alert & Oriented x 3   Erick Blinks, MD [facility]  01/23/2023 3:28 PM

## 2023-01-23 NOTE — Progress Notes (Unsigned)
Report to PACU, RN, vss, BBS= Clear.  

## 2023-01-23 NOTE — Progress Notes (Unsigned)
VS completed by AS. ? ?Pt's states no medical or surgical changes since previsit or office visit. ? ?

## 2023-01-23 NOTE — Patient Instructions (Addendum)
Resume previous diet Continue present medications There were no polyps seen today!  You will need another screening colonoscopy in 10 years, you will receive a letter at that time when you are due for the procedure.    Please call us at [phone] if you have a change in bowel habits, change in family history of colo-rectal cancer, rectal bleeding or other GI concern before that time.  YOU HAD AN ENDOSCOPIC PROCEDURE TODAY AT THE [facility]:   Refer to the procedure report that was given to you for any specific questions about what was found during the examination.  If the procedure report does not answer your questions, please call your gastroenterologist to clarify.  If you requested that your care partner not be given the details of your procedure findings, then the procedure report has been included in a sealed envelope for you to review at your convenience later.  YOU SHOULD EXPECT: Some feelings of bloating in the abdomen. Passage of more gas than usual.  Walking can help get rid of the air that was put into your GI tract during the procedure and reduce the bloating. If you had a lower endoscopy (such as a colonoscopy or flexible sigmoidoscopy) you may notice spotting of blood in your stool or on the toilet paper. If you underwent a bowel prep for your procedure, you may not have a normal bowel movement for a few days.  Please Note:  You might notice some irritation and congestion in your nose or some drainage.  This is from the oxygen used during your procedure.  There is no need for concern and it should clear up in a day or so.  SYMPTOMS TO REPORT IMMEDIATELY:  Following lower endoscopy (colonoscopy):  Excessive amounts of blood in the stool  Significant tenderness or worsening of abdominal pains  Swelling of the abdomen that is new, acute  Fever of 100F or higher  For urgent or emergent issues, a gastroenterologist can be reached at any hour by calling [phone]  [phone]. Do not use MyChart messaging for urgent concerns.   DIET:  We do recommend a small meal at first, but then you may proceed to your regular diet.  Drink plenty of fluids but you should avoid alcoholic beverages for 24 hours.  ACTIVITY:  You should plan to take it easy for the rest of today and you should NOT DRIVE or use heavy machinery until tomorrow (because of the sedation medicines used during the test).    FOLLOW UP: Our staff will call the number listed on your records the next business day following your procedure.  We will call around 7:15- 8:00 am to check on you and address any questions or concerns that you may have regarding the information given to you following your procedure. If we do not reach you, we will leave a message.     SIGNATURES/CONFIDENTIALITY: You and/or your care partner have signed paperwork which will be entered into your electronic medical record.  These signatures attest to the fact that that the information above on your After Visit Summary has been reviewed and is understood.  Full responsibility of the confidentiality of this discharge information lies with you and/or your care-partner.

## 2023-01-26 ENCOUNTER — Telehealth: Payer: Self-pay | Admitting: *Deleted

## 2023-01-26 NOTE — Telephone Encounter (Signed)
Attempted to call patient for their post-procedure follow-up call. No answer. Left voicemail.   

## 2023-04-16 ENCOUNTER — Ambulatory Visit (INDEPENDENT_AMBULATORY_CARE_PROVIDER_SITE_OTHER): Payer: Federal, State, Local not specified - PPO | Admitting: Family Medicine

## 2023-04-16 ENCOUNTER — Telehealth: Payer: Self-pay | Admitting: Pharmacist

## 2023-04-16 VITALS — HR 81 | Ht 69.0 in | Wt 233.0 lb

## 2023-04-16 DIAGNOSIS — L509 Urticaria, unspecified: Secondary | ICD-10-CM | POA: Diagnosis not present

## 2023-04-16 DIAGNOSIS — E785 Hyperlipidemia, unspecified: Secondary | ICD-10-CM | POA: Diagnosis not present

## 2023-04-16 DIAGNOSIS — Z0000 Encounter for general adult medical examination without abnormal findings: Secondary | ICD-10-CM

## 2023-04-16 DIAGNOSIS — Z0001 Encounter for general adult medical examination with abnormal findings: Secondary | ICD-10-CM | POA: Diagnosis not present

## 2023-04-16 MED ORDER — CETIRIZINE HCL 10 MG PO TABS
10.0000 mg | ORAL_TABLET | Freq: Every day | ORAL | 11 refills | Status: DC
Start: 2023-04-16 — End: 2023-12-17

## 2023-04-16 MED ORDER — REPATHA SURECLICK 140 MG/ML ~~LOC~~ SOAJ
140.0000 mg | SUBCUTANEOUS | 2 refills | Status: DC
Start: 2023-04-16 — End: 2023-08-03

## 2023-04-16 MED ORDER — LANSOPRAZOLE 30 MG PO CPDR: 30.0000 mg | DELAYED_RELEASE_CAPSULE | Freq: Every day | ORAL | 3 refills | Status: DC

## 2023-04-16 NOTE — Assessment & Plan Note (Signed)
Has some issues today.  See separate problems.  Preventative health care up-to-date excluding shingles vaccine.  He can get this at the pharmacy if he desires.  Labs reviewed with the patient.

## 2023-04-16 NOTE — Assessment & Plan Note (Signed)
Zyrtec as directed.  Referring to allergy.

## 2023-04-16 NOTE — Patient Instructions (Signed)
Medications as prescribed.  Follow up in 6 months.  Take care  Dr. Cook  

## 2023-04-16 NOTE — Progress Notes (Signed)
Subjective:  Patient ID: Justin [name], male    DOB: 10/16/1970  Age: 52 y.o. MRN: [id number]  CC: Chief Complaint  Patient presents with   Annual Exam    Hives more frequent now worse going on fo 1 yr   Gastroesophageal Reflux    Needs alternate medication , was never able to fill prevacid    HPI:  52 year old male presents for annual physical exam.  Patient's preventative health care is up-to-date excluding shingles vaccine.  He can get this at the pharmacy if he desires.  Overall he is doing okay.  Still struggles with IBS at baseline.  Needs refill on Prevacid.  Patient reports that he is recently had intermittent hives.  He states that this has been going on for the past 6 months.  Unsure of new changes or exposures.  No reports of shortness of breath or other associated symptoms.  Patient has had recent labs.  Labs notable for LDL of 176 despite Crestor.  Patient has a high coronary calcium score putting him at a higher risk.  Needs lipid-lowering therapy with PCSK9.    Patient Active Problem List   Diagnosis Date Noted   Urticaria 04/16/2023   Annual physical exam 04/02/2022   Elevated coronary artery calcium score 04/02/2022   Gastroesophageal reflux disease without esophagitis 09/15/2021   Hyperlipidemia 09/13/2021   Prediabetes 09/13/2021   Irritable bowel syndrome with both constipation and diarrhea 09/17/2012    Social Hx   Social History   Socioeconomic History   Marital status: Divorced    Spouse name: Not on file   Number of children: 2   Years of education: Not on file   Highest education level: Not on file  Occupational History    Employer: [employer]  Tobacco Use   Smoking status: Some Days    Types: E-cigarettes   Smokeless tobacco: Never  Vaping Use   Vaping status: Former  Substance and Sexual Activity   Alcohol use: Yes    Comment: occasionally   Drug use: No   Sexual activity: Not on file  Other Topics Concern    Not on file  Social History Narrative   Not on file   Social Determinants of Health   Financial Resource Strain: Not on file  Food Insecurity: Not on file  Transportation Needs: Not on file  Physical Activity: Not on file  Stress: Not on file  Social Connections: Not on file    Review of Systems Per HPI  Objective:  Pulse 81   Ht 5\' 9"  (1.753 m)   Wt 233 lb (105.7 kg)   SpO2 98%   BMI 34.41 kg/m      04/16/2023    1:32 PM 01/23/2023    4:12 PM 01/23/2023    4:02 PM  BP/Weight  Systolic BP  133 130  Diastolic BP  83 89  Wt. (Lbs) 233    BMI 34.41 kg/m2      Physical Exam Vitals and nursing note reviewed.  Constitutional:      General: He is not in acute distress.    Appearance: Normal appearance.  HENT:     Head: Normocephalic and atraumatic.  Eyes:     General:        Right eye: No discharge.        Left eye: No discharge.     Conjunctiva/sclera: Conjunctivae normal.  Cardiovascular:     Rate and Rhythm: Normal rate and regular rhythm.  Pulmonary:  Effort: Pulmonary effort is normal.     Breath sounds: Normal breath sounds. No wheezing, rhonchi or rales.  Skin:    General: Skin is warm.     Findings: No rash.  Neurological:     Mental Status: He is alert.  Psychiatric:        Mood and Affect: Mood normal.        Behavior: Behavior normal.     Lab Results  Component Value Date   WBC 6.4 01/08/2023   HGB 16.0 01/08/2023   HCT 48.3 01/08/2023   PLT 286 01/08/2023   GLUCOSE 98 01/08/2023   CHOL 246 (H) 01/08/2023   TRIG 89 01/08/2023   HDL 55 01/08/2023   LDLCALC 176 (H) 01/08/2023   ALT 31 01/08/2023   AST 22 01/08/2023   NA 139 01/08/2023   K 4.1 01/08/2023   CL 101 01/08/2023   CREATININE 1.14 01/08/2023   BUN 13 01/08/2023   CO2 22 01/08/2023   PSA 0.69 03/09/2015   HGBA1C 6.2 (H) 01/08/2023     Assessment & Plan:   Problem List Items Addressed This Visit       Musculoskeletal and Integument   Urticaria    Zyrtec as  directed.  Referring to allergy.      Relevant Orders   Ambulatory referral to Allergy     Other   Annual physical exam - Primary    Has some issues today.  See separate problems.  Preventative health care up-to-date excluding shingles vaccine.  He can get this at the pharmacy if he desires.  Labs reviewed with the patient.       Hyperlipidemia    Lipids uncontrolled and worsening.  Needs aggressive treatment with PCSK9 in the setting of markedly elevated coronary calcium score.  Sending and Repatha.  Pharmacy to help with prior authorization.      Relevant Medications   Evolocumab (REPATHA SURECLICK) 140 MG/ML SOAJ    Meds ordered this encounter  Medications   Evolocumab (REPATHA SURECLICK) 140 MG/ML SOAJ    Sig: Inject 140 mg into the skin every 14 (fourteen) days.    Dispense:  2 mL    Refill:  2   lansoprazole (PREVACID) 30 MG capsule    Sig: Take 1 capsule (30 mg total) by mouth daily.    Dispense:  90 capsule    Refill:  3   cetirizine (ZYRTEC) 10 MG tablet    Sig: Take 1 tablet (10 mg total) by mouth daily.    Dispense:  30 tablet    Refill:  11    Follow-up: 6 months  Crystian Frith Adriana Simas DO [facility]

## 2023-04-16 NOTE — Assessment & Plan Note (Signed)
Lipids uncontrolled and worsening.  Needs aggressive treatment with PCSK9 in the setting of markedly elevated coronary calcium score.  Sending and Repatha.  Pharmacy to help with prior authorization.

## 2023-04-16 NOTE — Progress Notes (Signed)
    Subjective   Patient ID: Justin [name], male    DOB: May 27, 1971  Age: 52 y.o. MRN: [id number]  Chief Complaint  Patient presents with   Prior Auth    Repatha    PCP requested PA for patient with HLD & elevated CAC score On Rosuvastatin 40mg  daily Lipid Panel     Component Value Date/Time   CHOL 246 (H) 01/08/2023 1606   TRIG 89 01/08/2023 1606   HDL 55 01/08/2023 1606   CHOLHDL 4.5 01/08/2023 1606   CHOLHDL 3.6 03/09/2015 1227   VLDL 15 03/09/2015 1227   LDLCALC 176 (H) 01/08/2023 1606   LABVLDL 15 01/08/2023 1606     Kieth Brightly, PharmD, BCACP Clinical Pharmacist, [facility]

## 2023-06-13 ENCOUNTER — Other Ambulatory Visit: Payer: Self-pay | Admitting: Family Medicine

## 2023-06-13 DIAGNOSIS — E785 Hyperlipidemia, unspecified: Secondary | ICD-10-CM

## 2023-07-13 ENCOUNTER — Ambulatory Visit: Payer: Federal, State, Local not specified - PPO | Admitting: Family Medicine

## 2023-07-13 ENCOUNTER — Telehealth: Payer: Self-pay

## 2023-07-13 VITALS — BP 131/91 | HR 87 | Temp 98.6°F | Ht 69.0 in | Wt 240.4 lb

## 2023-07-13 DIAGNOSIS — E785 Hyperlipidemia, unspecified: Secondary | ICD-10-CM | POA: Diagnosis not present

## 2023-07-13 DIAGNOSIS — R748 Abnormal levels of other serum enzymes: Secondary | ICD-10-CM | POA: Diagnosis not present

## 2023-07-13 DIAGNOSIS — R7303 Prediabetes: Secondary | ICD-10-CM

## 2023-07-13 DIAGNOSIS — K582 Mixed irritable bowel syndrome: Secondary | ICD-10-CM | POA: Diagnosis not present

## 2023-07-13 DIAGNOSIS — K219 Gastro-esophageal reflux disease without esophagitis: Secondary | ICD-10-CM

## 2023-07-13 DIAGNOSIS — Z23 Encounter for immunization: Secondary | ICD-10-CM

## 2023-07-13 NOTE — Assessment & Plan Note (Signed)
Continues an uncommon side effect of Repatha, 2% of the time. Given his elevated coronary calcium score feel that the benefits outweigh the risk.  Will continue Repatha and Crestor.  Awaiting lipid panel.

## 2023-07-13 NOTE — Progress Notes (Signed)
Subjective:  Patient ID: Justin [name], male    DOB: July 06, 1971  Age: 52 y.o. MRN: [id number]  CC: Follow up  HPI:  52 year old male presents for follow-up.  IBS stable on Librax.  Patient states that he is having some fatigue.  He is concerned that this may be due to Repatha.  Will discuss today.  Patient reports work stress and stress associated with dealing with the title to his house.  GERD stable on Prevacid.  Patient Active Problem List   Diagnosis Date Noted   Elevated coronary artery calcium score 04/02/2022   Gastroesophageal reflux disease without esophagitis 09/15/2021   Hyperlipidemia 09/13/2021   Prediabetes 09/13/2021   Irritable bowel syndrome with both constipation and diarrhea 09/17/2012    Social Hx   Social History   Socioeconomic History   Marital status: Divorced    Spouse name: Not on file   Number of children: 2   Years of education: Not on file   Highest education level: Not on file  Occupational History    Employer: [employer]  Tobacco Use   Smoking status: Some Days    Types: E-cigarettes   Smokeless tobacco: Never  Vaping Use   Vaping status: Former  Substance and Sexual Activity   Alcohol use: Yes    Comment: occasionally   Drug use: No   Sexual activity: Not on file  Other Topics Concern   Not on file  Social History Narrative   Not on file   Social Determinants of Health   Financial Resource Strain: Not on file  Food Insecurity: Not on file  Transportation Needs: Not on file  Physical Activity: Not on file  Stress: Not on file  Social Connections: Not on file    Review of Systems Per HPI  Objective:  BP (!) 131/91   Pulse 87   Temp 98.6 F (37 C) (Oral)   Ht 5\' 9"  (1.753 m)   Wt 240 lb 6.4 oz (109 kg)   SpO2 97%   BMI 35.50 kg/m      07/13/2023   10:00 AM 04/16/2023    1:32 PM 01/23/2023    4:12 PM  BP/Weight  Systolic BP 131  409  Diastolic BP 91  83  Wt. (Lbs) 240.4 233   BMI  35.5 kg/m2 34.41 kg/m2     Physical Exam Vitals and nursing note reviewed.  Constitutional:      General: He is not in acute distress.    Appearance: Normal appearance.  HENT:     Head: Normocephalic and atraumatic.  Eyes:     General:        Right eye: No discharge.        Left eye: No discharge.     Conjunctiva/sclera: Conjunctivae normal.  Cardiovascular:     Rate and Rhythm: Normal rate and regular rhythm.  Pulmonary:     Effort: Pulmonary effort is normal.     Breath sounds: Normal breath sounds. No wheezing, rhonchi or rales.  Neurological:     Mental Status: He is alert.  Psychiatric:        Mood and Affect: Mood normal.        Behavior: Behavior normal.     Lab Results  Component Value Date   WBC 6.4 01/08/2023   HGB 16.0 01/08/2023   HCT 48.3 01/08/2023   PLT 286 01/08/2023   GLUCOSE 98 01/08/2023   CHOL 246 (H) 01/08/2023   TRIG 89 01/08/2023  HDL 55 01/08/2023   LDLCALC 176 (H) 01/08/2023   ALT 31 01/08/2023   AST 22 01/08/2023   NA 139 01/08/2023   K 4.1 01/08/2023   CL 101 01/08/2023   CREATININE 1.14 01/08/2023   BUN 13 01/08/2023   CO2 22 01/08/2023   PSA 0.69 03/09/2015   HGBA1C 6.2 (H) 01/08/2023     Assessment & Plan:   Problem List Items Addressed This Visit       Digestive   Gastroesophageal reflux disease without esophagitis    Stable on Prevacid.  Continue.      Irritable bowel syndrome with both constipation and diarrhea    Stable on Librax.  Continue.        Other   Prediabetes   Relevant Orders   Hemoglobin A1c   Hyperlipidemia - Primary    Continues an uncommon side effect of Repatha, 2% of the time. Given his elevated coronary calcium score feel that the benefits outweigh the risk.  Will continue Repatha and Crestor.  Awaiting lipid panel.      Relevant Orders   Lipid panel   Other Visit Diagnoses     Needs flu shot       Relevant Orders   Flu vaccine trivalent PF, 6mos and  older(Flulaval,Afluria,Fluarix,Fluzone) (Completed)   Elevated alkaline phosphatase level       Relevant Orders   CMP14+EGFR      Follow-up:  6 months  Dmiya Malphrus Adriana Simas DO [facility]

## 2023-07-13 NOTE — Assessment & Plan Note (Signed)
Stable on Librax.  Continue.

## 2023-07-13 NOTE — Assessment & Plan Note (Signed)
Stable on Prevacid.  Continue.

## 2023-07-13 NOTE — Patient Instructions (Signed)
Labs ordered.  Continue your medications.  Follow up in 6 months.

## 2023-07-14 ENCOUNTER — Other Ambulatory Visit: Payer: Self-pay

## 2023-07-14 DIAGNOSIS — R7989 Other specified abnormal findings of blood chemistry: Secondary | ICD-10-CM

## 2023-07-14 LAB — LIPID PANEL
Chol/HDL Ratio: 2.6 {ratio} (ref 0.0–5.0)
Cholesterol, Total: 143 mg/dL (ref 100–199)
HDL: 56 mg/dL (ref >39)
LDL Chol Calc (NIH): 69 mg/dL (ref 0–99)
Triglycerides: 96 mg/dL (ref 0–149)
VLDL Cholesterol Cal: 18 mg/dL (ref 5–40)

## 2023-07-14 LAB — CMP14+EGFR
ALT: 27 [IU]/L (ref 0–44)
AST: 21 [IU]/L (ref 0–40)
Albumin: 4.2 g/dL (ref 3.8–4.9)
Alkaline Phosphatase: 127 [IU]/L — ABNORMAL HIGH (ref 44–121)
BUN/Creatinine Ratio: 14 (ref 9–20)
BUN: 19 mg/dL (ref 6–24)
Bilirubin Total: 0.4 mg/dL (ref 0.0–1.2)
CO2: 22 mmol/L (ref 20–29)
Calcium: 9.7 mg/dL (ref 8.7–10.2)
Chloride: 103 mmol/L (ref 96–106)
Creatinine, Ser: 1.32 mg/dL — ABNORMAL HIGH (ref 0.76–1.27)
Globulin, Total: 3.5 g/dL (ref 1.5–4.5)
Glucose: 145 mg/dL — ABNORMAL HIGH (ref 70–99)
Potassium: 4.5 mmol/L (ref 3.5–5.2)
Sodium: 138 mmol/L (ref 134–144)
Total Protein: 7.7 g/dL (ref 6.0–8.5)
eGFR: 65 mL/min/{1.73_m2} (ref >59)

## 2023-07-14 LAB — HEMOGLOBIN A1C
Est. average glucose Bld gHb Est-mCnc: 131 mg/dL
Hgb A1c MFr Bld: 6.2 % — ABNORMAL HIGH (ref 4.8–5.6)

## 2023-07-14 NOTE — Telephone Encounter (Signed)
error 

## 2023-08-03 ENCOUNTER — Other Ambulatory Visit: Payer: Self-pay | Admitting: Family Medicine

## 2023-08-04 ENCOUNTER — Encounter: Payer: Self-pay | Admitting: Family Medicine

## 2023-08-04 ENCOUNTER — Other Ambulatory Visit: Payer: Self-pay

## 2023-08-04 MED ORDER — LANSOPRAZOLE 30 MG PO CPDR: 30.0000 mg | DELAYED_RELEASE_CAPSULE | Freq: Every day | ORAL | 3 refills | Status: DC

## 2023-08-07 DIAGNOSIS — R7989 Other specified abnormal findings of blood chemistry: Secondary | ICD-10-CM | POA: Diagnosis not present

## 2023-08-08 LAB — BASIC METABOLIC PANEL
BUN/Creatinine Ratio: 8 — ABNORMAL LOW (ref 9–20)
BUN: 10 mg/dL (ref 6–24)
CO2: 23 mmol/L (ref 20–29)
Calcium: 9.4 mg/dL (ref 8.7–10.2)
Chloride: 103 mmol/L (ref 96–106)
Creatinine, Ser: 1.22 mg/dL (ref 0.76–1.27)
Glucose: 113 mg/dL — ABNORMAL HIGH (ref 70–99)
Potassium: 4.3 mmol/L (ref 3.5–5.2)
Sodium: 140 mmol/L (ref 134–144)
eGFR: 72 mL/min/{1.73_m2} (ref >59)

## 2023-08-10 ENCOUNTER — Telehealth: Payer: Self-pay

## 2023-08-10 ENCOUNTER — Other Ambulatory Visit: Payer: Self-pay | Admitting: Family Medicine

## 2023-08-10 MED ORDER — OMEPRAZOLE 40 MG PO CPDR: 40.0000 mg | DELAYED_RELEASE_CAPSULE | Freq: Every day | ORAL | 3 refills | Status: DC

## 2023-08-10 NOTE — Telephone Encounter (Signed)
Patient has been informed per his results , he is requesting something meanwhile until around 09/13/23 when he will able to refill prevacid , [facility] [place]

## 2023-11-15 ENCOUNTER — Other Ambulatory Visit: Payer: Self-pay | Admitting: Family Medicine

## 2023-12-15 ENCOUNTER — Other Ambulatory Visit: Payer: Self-pay | Admitting: Family Medicine

## 2023-12-15 DIAGNOSIS — K219 Gastro-esophageal reflux disease without esophagitis: Secondary | ICD-10-CM

## 2023-12-15 DIAGNOSIS — E785 Hyperlipidemia, unspecified: Secondary | ICD-10-CM

## 2023-12-16 ENCOUNTER — Other Ambulatory Visit: Payer: Self-pay | Admitting: Family Medicine

## 2023-12-29 ENCOUNTER — Other Ambulatory Visit: Payer: Self-pay | Admitting: Family Medicine

## 2024-01-07 ENCOUNTER — Ambulatory Visit: Admitting: Family Medicine

## 2024-01-14 ENCOUNTER — Ambulatory Visit: Admitting: Family Medicine

## 2024-01-14 VITALS — BP 128/89 | HR 92 | Temp 98.1°F | Ht 69.0 in

## 2024-01-14 DIAGNOSIS — F419 Anxiety disorder, unspecified: Secondary | ICD-10-CM

## 2024-01-14 DIAGNOSIS — R7303 Prediabetes: Secondary | ICD-10-CM

## 2024-01-14 DIAGNOSIS — E785 Hyperlipidemia, unspecified: Secondary | ICD-10-CM

## 2024-01-14 DIAGNOSIS — K582 Mixed irritable bowel syndrome: Secondary | ICD-10-CM | POA: Diagnosis not present

## 2024-01-14 DIAGNOSIS — Z130 Encounter for screening for diseases of the blood and blood-forming organs and certain disorders involving the immune mechanism: Secondary | ICD-10-CM | POA: Diagnosis not present

## 2024-01-14 DIAGNOSIS — M546 Pain in thoracic spine: Secondary | ICD-10-CM

## 2024-01-14 MED ORDER — BACLOFEN 10 MG PO TABS: 5.0000 mg | ORAL_TABLET | Freq: Three times a day (TID) | ORAL | 0 refills | Status: AC | PRN

## 2024-01-14 MED ORDER — SERTRALINE HCL 50 MG PO TABS
50.0000 mg | ORAL_TABLET | Freq: Every day | ORAL | 1 refills | Status: DC
Start: 2024-01-14 — End: 2024-06-15

## 2024-01-14 NOTE — Patient Instructions (Signed)
Medications as prescribed.  Labs today.  Follow up in 6 months.  

## 2024-01-15 LAB — LIPID PANEL
Chol/HDL Ratio: 2.7 ratio (ref 0.0–5.0)
Cholesterol, Total: 109 mg/dL (ref 100–199)
HDL: 41 mg/dL (ref >39)
LDL Chol Calc (NIH): 53 mg/dL (ref 0–99)
Triglycerides: 75 mg/dL (ref 0–149)
VLDL Cholesterol Cal: 15 mg/dL (ref 5–40)

## 2024-01-15 LAB — CBC
Hematocrit: 48.6 % (ref 37.5–51.0)
Hemoglobin: 16.1 g/dL (ref 13.0–17.7)
MCH: 27.8 pg (ref 26.6–33.0)
MCHC: 33.1 g/dL (ref 31.5–35.7)
MCV: 84 fL (ref 79–97)
Platelets: 273 10*3/uL (ref 150–450)
RBC: 5.8 x10E6/uL (ref 4.14–5.80)
RDW: 15.1 % (ref 11.6–15.4)
WBC: 6.7 10*3/uL (ref 3.4–10.8)

## 2024-01-15 LAB — CMP14+EGFR
ALT: 43 IU/L (ref 0–44)
AST: 27 IU/L (ref 0–40)
Albumin: 4.6 g/dL (ref 3.8–4.9)
Alkaline Phosphatase: 135 IU/L — ABNORMAL HIGH (ref 44–121)
BUN/Creatinine Ratio: 9 (ref 9–20)
BUN: 10 mg/dL (ref 6–24)
Bilirubin Total: 0.5 mg/dL (ref 0.0–1.2)
CO2: 22 mmol/L (ref 20–29)
Calcium: 9.9 mg/dL (ref 8.7–10.2)
Chloride: 102 mmol/L (ref 96–106)
Creatinine, Ser: 1.15 mg/dL (ref 0.76–1.27)
Globulin, Total: 3.4 g/dL (ref 1.5–4.5)
Glucose: 104 mg/dL — ABNORMAL HIGH (ref 70–99)
Potassium: 4.1 mmol/L (ref 3.5–5.2)
Sodium: 139 mmol/L (ref 134–144)
Total Protein: 8 g/dL (ref 6.0–8.5)
eGFR: 77 mL/min/{1.73_m2} (ref >59)

## 2024-01-15 LAB — HEMOGLOBIN A1C
Est. average glucose Bld gHb Est-mCnc: 146 mg/dL
Hgb A1c MFr Bld: 6.7 % — ABNORMAL HIGH (ref 4.8–5.6)

## 2024-01-17 ENCOUNTER — Encounter: Payer: Self-pay | Admitting: Family Medicine

## 2024-01-17 DIAGNOSIS — F419 Anxiety disorder, unspecified: Secondary | ICD-10-CM | POA: Insufficient documentation

## 2024-01-17 DIAGNOSIS — M546 Pain in thoracic spine: Secondary | ICD-10-CM | POA: Insufficient documentation

## 2024-01-17 NOTE — Progress Notes (Signed)
 Subjective:  Patient ID: Justin [name], male    DOB: [birth year]  Age: 53 y.o. MRN: [id number]  CC:   Chief Complaint  Patient presents with   discuss IBS- regular flare ups    left flank pain     4 weeks now     HPI:  53 year old male presents for follow up.  Recent worsening of IBS. He believes this is due to an increase in stress/anxiety.  Will discuss this today.  Patient also reports left-sided thoracic back pain.  Started in March and has continued to be bothersome.  Intermittent.  Very sharp at times.  No relieving factors.  Patient requesting labs today as well.  Patient Active Problem List   Diagnosis Date Noted   Anxiety 01/17/2024   Thoracic back pain 01/17/2024   Elevated coronary artery calcium  score 04/02/2022   Gastroesophageal reflux disease without esophagitis 09/15/2021   Hyperlipidemia 09/13/2021   Prediabetes 09/13/2021   Irritable bowel syndrome with both constipation and diarrhea 09/17/2012    Social Hx   Social History   Socioeconomic History   Marital status: Divorced    Spouse name: Not on file   Number of children: 2   Years of education: Not on file   Highest education level: Some college, no degree  Occupational History    Employer: [employer]  Tobacco Use   Smoking status: Some Days    Types: E-cigarettes   Smokeless tobacco: Never  Vaping Use   Vaping status: Former  Substance and Sexual Activity   Alcohol use: Yes    Comment: occasionally   Drug use: No   Sexual activity: Not on file  Other Topics Concern   Not on file  Social History Narrative   Not on file   Social Drivers of Health   Financial Resource Strain: Low Risk  (01/13/2024)   Overall Financial Resource Strain (CARDIA)    Difficulty of Paying Living Expenses: Not hard at all  Food Insecurity: No Food Insecurity (01/13/2024)   Hunger Vital Sign    Worried About Running Out of Food in the Last Year: Never true    Ran Out of Food in the Last  Year: Never true  Transportation Needs: No Transportation Needs (01/13/2024)   PRAPARE - Administrator, Civil Service (Medical): No    Lack of Transportation (Non-Medical): No  Physical Activity: Sufficiently Active (01/13/2024)   Exercise Vital Sign    Days of Exercise per Week: 4 days    Minutes of Exercise per Session: 60 min  Stress: Stress Concern Present (01/13/2024)   [employer] of Occupational Health - Occupational Stress Questionnaire    Feeling of Stress : Rather much  Social Connections: Moderately Isolated (01/13/2024)   Social Connection and Isolation Panel [NHANES]    Frequency of Communication with Friends and Family: Once a week    Frequency of Social Gatherings with Friends and Family: Once a week    Attends Religious Services: 1 to 4 times per year    Active Member of [employer] or Organizations: Yes    Attends Banker Meetings: 1 to 4 times per year    Marital Status: Divorced    Review of Systems Per HPI  Objective:  BP 128/89   Pulse 92   Temp 98.1 F (36.7 C)   Ht 5\' 9"  (1.753 m)   SpO2 98%   BMI 35.50 kg/m      01/14/2024    3:30  PM 07/13/2023   10:00 AM 04/16/2023    1:32 PM  BP/Weight  Systolic BP 128 131   Diastolic BP 89 91   Wt. (Lbs)  240.4 233  BMI  35.5 kg/m2 34.41 kg/m2    Physical Exam Vitals and nursing note reviewed.  Constitutional:      General: He is not in acute distress.    Appearance: Normal appearance.  HENT:     Head: Normocephalic and atraumatic.  Eyes:     General:        Right eye: No discharge.        Left eye: No discharge.     Conjunctiva/sclera: Conjunctivae normal.  Cardiovascular:     Rate and Rhythm: Normal rate and regular rhythm.  Pulmonary:     Effort: Pulmonary effort is normal.     Breath sounds: Normal breath sounds. No wheezing, rhonchi or rales.  Neurological:     Mental Status: He is alert.  Psychiatric:        Mood and Affect: Mood normal.        Behavior: Behavior  normal.     Lab Results  Component Value Date   WBC 6.7 01/14/2024   HGB 16.1 01/14/2024   HCT 48.6 01/14/2024   PLT 273 01/14/2024   GLUCOSE 104 (H) 01/14/2024   CHOL 109 01/14/2024   TRIG 75 01/14/2024   HDL 41 01/14/2024   LDLCALC 53 01/14/2024   ALT 43 01/14/2024   AST 27 01/14/2024   NA 139 01/14/2024   K 4.1 01/14/2024   CL 102 01/14/2024   CREATININE 1.15 01/14/2024   BUN 10 01/14/2024   CO2 22 01/14/2024   PSA 0.69 03/09/2015   HGBA1C 6.7 (H) 01/14/2024     Assessment & Plan:  Irritable bowel syndrome with both constipation and diarrhea Assessment & Plan: Recent worsening due to stress/anxiety.  Continuing current treatment with Librax.  Addressing anxiety.  See separate problem.   Prediabetes -     CMP14+EGFR -     Hemoglobin A1c  Hyperlipidemia, unspecified hyperlipidemia type -     Lipid panel  Screening for deficiency anemia -     CBC  Anxiety Assessment & Plan: After discussion today, starting sertraline .  Orders: -     Sertraline  HCl; Take 1 tablet (50 mg total) by mouth daily.  Dispense: 90 tablet; Refill: 1  Left-sided thoracic back pain, unspecified chronicity Assessment & Plan: Secondary to spasm.  Baclofen  as directed.  Orders: -     Baclofen ; Take 0.5-1 tablets (5-10 mg total) by mouth 3 (three) times daily as needed for muscle spasms.  Dispense: 30 each; Refill: 0    Follow-up:  6 months  Shanyn Preisler Debrah Fan DO [facility]

## 2024-01-17 NOTE — Assessment & Plan Note (Signed)
 Recent worsening due to stress/anxiety.  Continuing current treatment with Librax.  Addressing anxiety.  See separate problem.

## 2024-01-17 NOTE — Assessment & Plan Note (Signed)
 After discussion today, starting sertraline .

## 2024-01-17 NOTE — Assessment & Plan Note (Signed)
 Secondary to spasm.  Baclofen  as directed.

## 2024-04-04 ENCOUNTER — Other Ambulatory Visit (HOSPITAL_COMMUNITY): Payer: Self-pay

## 2024-04-09 ENCOUNTER — Other Ambulatory Visit: Payer: Self-pay | Admitting: Family Medicine

## 2024-04-09 DIAGNOSIS — K582 Mixed irritable bowel syndrome: Secondary | ICD-10-CM

## 2024-04-11 ENCOUNTER — Other Ambulatory Visit: Payer: Self-pay

## 2024-04-11 DIAGNOSIS — K582 Mixed irritable bowel syndrome: Secondary | ICD-10-CM

## 2024-04-11 MED ORDER — CILIDINIUM-CHLORDIAZEPOXIDE 2.5-5 MG PO CAPS: 1.0000 | ORAL_CAPSULE | Freq: Two times a day (BID) | ORAL | 3 refills | Status: AC

## 2024-04-13 ENCOUNTER — Telehealth: Payer: Self-pay | Admitting: Pharmacy Technician

## 2024-04-13 ENCOUNTER — Other Ambulatory Visit (HOSPITAL_COMMUNITY): Payer: Self-pay

## 2024-04-13 NOTE — Telephone Encounter (Signed)
 Pharmacy Patient Advocate Encounter   Received notification from CoverMyMeds that prior authorization for Repatha  SureClick 140MG /ML auto-injectors is required/requested.   Insurance verification completed.   The patient is insured through [facility] .   Per test claim: PA required; PA submitted to above mentioned insurance via LATENT Key/confirmation #/EOC [id number] Status is pending

## 2024-04-14 ENCOUNTER — Other Ambulatory Visit (HOSPITAL_COMMUNITY): Payer: Self-pay

## 2024-04-14 NOTE — Telephone Encounter (Signed)
 Pharmacy Patient Advocate Encounter  Received notification from [facility] that Prior Authorization for Repatha  SureClick 140MG /ML auto-injectors  has been APPROVED from 04/13/2024 to 04/13/2025.   PA #/Case ID/Reference #: [id number]

## 2024-04-28 ENCOUNTER — Ambulatory Visit: Admitting: Family Medicine

## 2024-04-28 ENCOUNTER — Encounter: Payer: Self-pay | Admitting: Family Medicine

## 2024-04-28 VITALS — BP 128/89 | HR 85 | Temp 98.2°F | Ht 69.0 in | Wt 234.1 lb

## 2024-04-28 DIAGNOSIS — E119 Type 2 diabetes mellitus without complications: Secondary | ICD-10-CM

## 2024-04-28 DIAGNOSIS — Z0001 Encounter for general adult medical examination with abnormal findings: Secondary | ICD-10-CM

## 2024-04-28 DIAGNOSIS — R21 Rash and other nonspecific skin eruption: Secondary | ICD-10-CM

## 2024-04-28 DIAGNOSIS — Z0000 Encounter for general adult medical examination without abnormal findings: Secondary | ICD-10-CM

## 2024-04-28 MED ORDER — KETOCONAZOLE 2 % EX CREA: 1.0000 | TOPICAL_CREAM | Freq: Every day | CUTANEOUS | 1 refills | Status: DC

## 2024-04-28 MED ORDER — DESONIDE 0.05 % EX OINT: 1.0000 | TOPICAL_OINTMENT | Freq: Two times a day (BID) | CUTANEOUS | 0 refills | Status: DC | PRN

## 2024-04-28 NOTE — Patient Instructions (Signed)
 Lab ordered.  Medications sent in.  Follow up in 3 months.

## 2024-04-29 LAB — HEMOGLOBIN A1C
Est. average glucose Bld gHb Est-mCnc: 140 mg/dL
Hgb A1c MFr Bld: 6.5 % — ABNORMAL HIGH (ref 4.8–5.6)

## 2024-04-30 ENCOUNTER — Ambulatory Visit: Payer: Self-pay | Admitting: Family Medicine

## 2024-04-30 DIAGNOSIS — E119 Type 2 diabetes mellitus without complications: Secondary | ICD-10-CM | POA: Insufficient documentation

## 2024-05-01 DIAGNOSIS — Z0000 Encounter for general adult medical examination without abnormal findings: Secondary | ICD-10-CM | POA: Insufficient documentation

## 2024-05-01 DIAGNOSIS — R21 Rash and other nonspecific skin eruption: Secondary | ICD-10-CM | POA: Insufficient documentation

## 2024-05-01 NOTE — Progress Notes (Signed)
 Subjective:  Patient ID: Justin [name], male    DOB: Jan 26, 1971  Age: 53 y.o. MRN: [id number]  CC:   Chief Complaint  Patient presents with   Annual Exam    HPI:  53 year old male presents for an annual exam.  Patient states that overall he is doing well. He does note recent stressor - mother has moved in with him.  Additionally, he is having dryness/irritation in the beard region. He is requesting topical steroid (he has used previously with improvement).   Last A1c was consistent with Diabetes (6.7). Needs recheck today.  Lipids have been well controlled.   Patient Active Problem List   Diagnosis Date Noted   Annual physical exam 05/01/2024   Rash 05/01/2024   Type 2 diabetes mellitus without complications (HCC) 04/30/2024   Anxiety 01/17/2024   Elevated coronary artery calcium  score 04/02/2022   Gastroesophageal reflux disease without esophagitis 09/15/2021   Hyperlipidemia 09/13/2021   Irritable bowel syndrome with both constipation and diarrhea 09/17/2012    Social Hx   Social History   Socioeconomic History   Marital status: Divorced    Spouse name: Not on file   Number of children: 2   Years of education: Not on file   Highest education level: Some college, no degree  Occupational History    Employer: [employer]  Tobacco Use   Smoking status: Some Days    Types: E-cigarettes   Smokeless tobacco: Never  Vaping Use   Vaping status: Former  Substance and Sexual Activity   Alcohol use: Yes    Comment: occasionally   Drug use: No   Sexual activity: Not on file  Other Topics Concern   Not on file  Social History Narrative   Not on file   Social Drivers of Health   Financial Resource Strain: Low Risk  (01/13/2024)   Overall Financial Resource Strain (CARDIA)    Difficulty of Paying Living Expenses: Not hard at all  Food Insecurity: No Food Insecurity (01/13/2024)   Hunger Vital Sign    Worried About Running Out of Food in the Last  Year: Never true    Ran Out of Food in the Last Year: Never true  Transportation Needs: No Transportation Needs (01/13/2024)   PRAPARE - Administrator, Civil Service (Medical): No    Lack of Transportation (Non-Medical): No  Physical Activity: Sufficiently Active (01/13/2024)   Exercise Vital Sign    Days of Exercise per Week: 4 days    Minutes of Exercise per Session: 60 min  Stress: Stress Concern Present (01/13/2024)   [employer] of Occupational Health - Occupational Stress Questionnaire    Feeling of Stress : Rather much  Social Connections: Moderately Isolated (01/13/2024)   Social Connection and Isolation Panel    Frequency of Communication with Friends and Family: Once a week    Frequency of Social Gatherings with Friends and Family: Once a week    Attends Religious Services: 1 to 4 times per year    Active Member of [employer] or Organizations: Yes    Attends Banker Meetings: 1 to 4 times per year    Marital Status: Divorced    Review of Systems Per HPI  Objective:  BP 128/89   Pulse 85   Temp 98.2 F (36.8 C)   Ht 5' 9 (1.753 m)   Wt 234 lb 2 oz (106.2 kg)   SpO2 98%   BMI 34.57 kg/m  04/28/2024    2:14 PM 01/14/2024    3:30 PM 07/13/2023   10:00 AM  BP/Weight  Systolic BP 128 128 131  Diastolic BP 89 89 91  Wt. (Lbs) 234.13  240.4  BMI 34.57 kg/m2  35.5 kg/m2    Physical Exam Vitals and nursing note reviewed.  Constitutional:      General: He is not in acute distress.    Appearance: Normal appearance.  HENT:     Head: Normocephalic and atraumatic.  Eyes:     General:        Right eye: No discharge.        Left eye: No discharge.     Conjunctiva/sclera: Conjunctivae normal.  Cardiovascular:     Rate and Rhythm: Normal rate and regular rhythm.  Pulmonary:     Effort: Pulmonary effort is normal.     Breath sounds: Normal breath sounds. No wheezing, rhonchi or rales.  Skin:    Comments: Dry and hypopigmented areas  noted on the scalp and beard region.  Neurological:     Mental Status: He is alert.  Psychiatric:        Mood and Affect: Mood normal.        Behavior: Behavior normal.     Lab Results  Component Value Date   WBC 6.7 01/14/2024   HGB 16.1 01/14/2024   HCT 48.6 01/14/2024   PLT 273 01/14/2024   GLUCOSE 104 (H) 01/14/2024   CHOL 109 01/14/2024   TRIG 75 01/14/2024   HDL 41 01/14/2024   LDLCALC 53 01/14/2024   ALT 43 01/14/2024   AST 27 01/14/2024   NA 139 01/14/2024   K 4.1 01/14/2024   CL 102 01/14/2024   CREATININE 1.15 01/14/2024   BUN 10 01/14/2024   CO2 22 01/14/2024   PSA 0.69 03/09/2015   HGBA1C 6.5 (H) 04/28/2024     Assessment & Plan:  Annual physical exam Assessment & Plan: Overall doing well. Handling mother moving in well. Needs A1c today. Will do foot exam, urine microalbumin, and discuss Flu and pneumococcal vaccine at follow up.    Type 2 diabetes mellitus without complication, without long-term current use of insulin (HCC) -     Hemoglobin A1c  Rash Assessment & Plan: Concern for Tinea vs Seborrheic dermatitis. Treating with Desonide  and Ketoconazole .   Orders: -     Ketoconazole ; Apply 1 Application topically daily.  Dispense: 60 g; Refill: 1 -     Desonide ; Apply 1 Application topically 2 (two) times daily as needed.  Dispense: 60 g; Refill: 0   Follow-up:  3 months  Marchetta Navratil Bluford DO [facility]

## 2024-05-01 NOTE — Assessment & Plan Note (Signed)
 Concern for Tinea vs Seborrheic dermatitis. Treating with Desonide  and Ketoconazole .

## 2024-05-01 NOTE — Assessment & Plan Note (Signed)
 Overall doing well. Handling mother moving in well. Needs A1c today. Will do foot exam, urine microalbumin, and discuss Flu and pneumococcal vaccine at follow up.

## 2024-06-07 ENCOUNTER — Other Ambulatory Visit: Payer: Self-pay | Admitting: Family Medicine

## 2024-06-07 DIAGNOSIS — E785 Hyperlipidemia, unspecified: Secondary | ICD-10-CM

## 2024-06-15 ENCOUNTER — Encounter: Payer: Self-pay | Admitting: Family Medicine

## 2024-06-15 ENCOUNTER — Other Ambulatory Visit: Payer: Self-pay | Admitting: Family Medicine

## 2024-06-15 DIAGNOSIS — F419 Anxiety disorder, unspecified: Secondary | ICD-10-CM

## 2024-06-15 MED ORDER — SERTRALINE HCL 100 MG PO TABS: 100.0000 mg | ORAL_TABLET | Freq: Every day | ORAL | 3 refills | Status: AC

## 2024-06-29 ENCOUNTER — Other Ambulatory Visit: Payer: Self-pay | Admitting: Family Medicine

## 2024-07-08 ENCOUNTER — Ambulatory Visit: Admitting: Family Medicine

## 2024-07-08 ENCOUNTER — Encounter: Payer: Self-pay | Admitting: Family Medicine

## 2024-07-08 ENCOUNTER — Other Ambulatory Visit: Payer: Self-pay | Admitting: Family Medicine

## 2024-07-08 VITALS — BP 137/90 | HR 80 | Ht 68.5 in | Wt 227.0 lb

## 2024-07-08 DIAGNOSIS — K582 Mixed irritable bowel syndrome: Secondary | ICD-10-CM

## 2024-07-08 NOTE — Patient Instructions (Signed)
Follow up in 6 months.  Take care  Dr. Wren Gallaga  

## 2024-07-10 NOTE — Progress Notes (Signed)
 Subjective:  Patient ID: Justin [name], male    DOB: 1970-11-24  Age: 53 y.o. MRN: [id number]  CC:   Chief Complaint  Patient presents with   Irritable Bowel Syndrome    Follow up     HPI:  53 year old male presents for follow up regarding IBS.  Patient works for the [employer]. He states that he is now working more often in the office. Previously he worked from home. His IBS has been worse lately, primarily due to stress. As a result, it has been difficult for him at work. He is on Zoloft  and Librax. He would like accommodation so that he can work from home. Has paperwork from his job for me to fill out today.  Patient Active Problem List   Diagnosis Date Noted   Annual physical exam 05/01/2024   Rash 05/01/2024   Type 2 diabetes mellitus without complications (HCC) 04/30/2024   Anxiety 01/17/2024   Elevated coronary artery calcium  score 04/02/2022   Gastroesophageal reflux disease without esophagitis 09/15/2021   Hyperlipidemia 09/13/2021   Irritable bowel syndrome with both constipation and diarrhea 09/17/2012    Social Hx   Social History   Socioeconomic History   Marital status: Divorced    Spouse name: Not on file   Number of children: 2   Years of education: Not on file   Highest education level: Some college, no degree  Occupational History    Employer: [employer]  Tobacco Use   Smoking status: Some Days    Types: E-cigarettes   Smokeless tobacco: Never  Vaping Use   Vaping status: Former  Substance and Sexual Activity   Alcohol use: Yes    Comment: occasionally   Drug use: No   Sexual activity: Not on file  Other Topics Concern   Not on file  Social History Narrative   Not on file   Social Drivers of Health   Financial Resource Strain: High Risk (07/08/2024)   Overall Financial Resource Strain (CARDIA)    Difficulty of Paying Living Expenses: Very hard  Food Insecurity: Food Insecurity Present (07/08/2024)   Hunger Vital  Sign    Worried About Programme researcher, broadcasting/film/video in the Last Year: Often true    Ran Out of Food in the Last Year: Never true  Transportation Needs: Unmet Transportation Needs (07/08/2024)   PRAPARE - Transportation    Lack of Transportation (Medical): No    Lack of Transportation (Non-Medical): Yes  Physical Activity: Insufficiently Active (07/08/2024)   Exercise Vital Sign    Days of Exercise per Week: 2 days    Minutes of Exercise per Session: 20 min  Stress: Stress Concern Present (07/08/2024)   [employer] of Occupational Health - Occupational Stress Questionnaire    Feeling of Stress: Very much  Social Connections: Moderately Isolated (07/08/2024)   Social Connection and Isolation Panel    Frequency of Communication with Friends and Family: More than three times a week    Frequency of Social Gatherings with Friends and Family: More than three times a week    Attends Religious Services: 1 to 4 times per year    Active Member of [employer] or Organizations: No    Attends Engineer, structural: Not on file    Marital Status: Divorced    Review of Systems Per HPI  Objective:  BP (!) 137/90   Pulse 80   Ht 5' 8.5 (1.74 m)   Wt 227 lb (103 kg)  SpO2 96%   BMI 34.01 kg/m      07/08/2024   10:57 AM 04/28/2024    2:14 PM 01/14/2024    3:30 PM  BP/Weight  Systolic BP 137 128 128  Diastolic BP 90 89 89  Wt. (Lbs) 227 234.13   BMI 34.01 kg/m2 34.57 kg/m2     Physical Exam Vitals and nursing note reviewed.  Constitutional:      General: He is not in acute distress.    Appearance: Normal appearance.  HENT:     Head: Normocephalic and atraumatic.  Pulmonary:     Effort: Pulmonary effort is normal.     Breath sounds: Normal breath sounds. No wheezing or rales.  Neurological:     Mental Status: He is alert.  Psychiatric:        Mood and Affect: Mood normal.        Behavior: Behavior normal.     Lab Results  Component Value Date   WBC 6.7 01/14/2024   HGB  16.1 01/14/2024   HCT 48.6 01/14/2024   PLT 273 01/14/2024   GLUCOSE 104 (H) 01/14/2024   CHOL 109 01/14/2024   TRIG 75 01/14/2024   HDL 41 01/14/2024   LDLCALC 53 01/14/2024   ALT 43 01/14/2024   AST 27 01/14/2024   NA 139 01/14/2024   K 4.1 01/14/2024   CL 102 01/14/2024   CREATININE 1.15 01/14/2024   BUN 10 01/14/2024   CO2 22 01/14/2024   PSA 0.69 03/09/2015   HGBA1C 6.5 (H) 04/28/2024     Assessment & Plan:  Irritable bowel syndrome with both constipation and diarrhea Assessment & Plan: Worse as of late. Forms filled out today.    Follow-up:  6 months  20 minutes spent during this encounter (taking history from patient, examining patient, and fill out required forms/documentation for work accomodation).  Jacqulyn Ahle DO [facility]

## 2024-07-10 NOTE — Assessment & Plan Note (Signed)
 Worse as of late. Forms filled out today.

## 2024-08-03 ENCOUNTER — Encounter: Payer: Self-pay | Admitting: Family Medicine

## 2024-08-09 ENCOUNTER — Ambulatory Visit: Admitting: Family Medicine

## 2024-08-09 ENCOUNTER — Encounter: Payer: Self-pay | Admitting: Family Medicine

## 2024-08-09 VITALS — BP 132/87 | HR 65 | Temp 98.4°F | Ht 68.5 in | Wt 230.0 lb

## 2024-08-09 DIAGNOSIS — E785 Hyperlipidemia, unspecified: Secondary | ICD-10-CM

## 2024-08-09 DIAGNOSIS — E119 Type 2 diabetes mellitus without complications: Secondary | ICD-10-CM | POA: Diagnosis not present

## 2024-08-09 DIAGNOSIS — K582 Mixed irritable bowel syndrome: Secondary | ICD-10-CM

## 2024-08-09 NOTE — Progress Notes (Signed)
 Subjective:  Patient ID: Justin [name], male    DOB: 06-11-1971  Age: 53 y.o. MRN: [id number]  CC:   Chief Complaint  Patient presents with   reasonable accomodation letter     HPI:  53 year old male presents for the above.  Patient is requesting reasonable accomodation through his employer to be able to work from home due to IBS (mixed). He has drafted and letter and would like me to review and sign today.  He also would like labs to be done today. Last A1c was 6.5. Currently managing with lifestyle.  Lipids at goal on Crestor  and Repatha . Last LDL was 53.   Patient Active Problem List   Diagnosis Date Noted   Annual physical exam 05/01/2024   Type 2 diabetes mellitus without complications (HCC) 04/30/2024   Anxiety 01/17/2024   Elevated coronary artery calcium  score 04/02/2022   Gastroesophageal reflux disease without esophagitis 09/15/2021   Hyperlipidemia 09/13/2021   Irritable bowel syndrome with both constipation and diarrhea 09/17/2012    Social Hx   Social History   Socioeconomic History   Marital status: Divorced    Spouse name: Not on file   Number of children: 2   Years of education: Not on file   Highest education level: Some college, no degree  Occupational History    Employer: [employer]  Tobacco Use   Smoking status: Some Days    Types: E-cigarettes   Smokeless tobacco: Never  Vaping Use   Vaping status: Former  Substance and Sexual Activity   Alcohol use: Yes    Comment: occasionally   Drug use: No   Sexual activity: Not on file  Other Topics Concern   Not on file  Social History Narrative   Not on file   Social Drivers of Health   Financial Resource Strain: High Risk (07/08/2024)   Overall Financial Resource Strain (CARDIA)    Difficulty of Paying Living Expenses: Very hard  Food Insecurity: Food Insecurity Present (07/08/2024)   Hunger Vital Sign    Worried About Programme Researcher, Broadcasting/film/video in the Last Year: Often true     Ran Out of Food in the Last Year: Never true  Transportation Needs: Unmet Transportation Needs (07/08/2024)   PRAPARE - Transportation    Lack of Transportation (Medical): No    Lack of Transportation (Non-Medical): Yes  Physical Activity: Insufficiently Active (07/08/2024)   Exercise Vital Sign    Days of Exercise per Week: 2 days    Minutes of Exercise per Session: 20 min  Stress: Stress Concern Present (07/08/2024)   [employer] of Occupational Health - Occupational Stress Questionnaire    Feeling of Stress: Very much  Social Connections: Moderately Isolated (07/08/2024)   Social Connection and Isolation Panel    Frequency of Communication with Friends and Family: More than three times a week    Frequency of Social Gatherings with Friends and Family: More than three times a week    Attends Religious Services: 1 to 4 times per year    Active Member of [employer] or Organizations: No    Attends Engineer, Structural: Not on file    Marital Status: Divorced    Review of Systems Per HPI  Objective:  BP 132/87   Pulse 65   Temp 98.4 F (36.9 C)   Ht 5' 8.5 (1.74 m)   Wt 230 lb (104.3 kg)   SpO2 97%   BMI 34.46 kg/m  08/09/2024    2:15 PM 07/08/2024   10:57 AM 04/28/2024    2:14 PM  BP/Weight  Systolic BP 132 137 128  Diastolic BP 87 90 89  Wt. (Lbs) 230 227 234.13  BMI 34.46 kg/m2 34.01 kg/m2 34.57 kg/m2    Physical Exam Vitals and nursing note reviewed.  Constitutional:      General: He is not in acute distress.    Appearance: Normal appearance.  HENT:     Head: Normocephalic and atraumatic.  Cardiovascular:     Rate and Rhythm: Normal rate and regular rhythm.  Pulmonary:     Effort: Pulmonary effort is normal.     Breath sounds: Normal breath sounds.  Neurological:     Mental Status: He is alert.  Psychiatric:        Mood and Affect: Mood normal.        Behavior: Behavior normal.     Lab Results  Component Value Date   WBC 6.7  01/14/2024   HGB 16.1 01/14/2024   HCT 48.6 01/14/2024   PLT 273 01/14/2024   GLUCOSE 104 (H) 01/14/2024   CHOL 109 01/14/2024   TRIG 75 01/14/2024   HDL 41 01/14/2024   LDLCALC 53 01/14/2024   ALT 43 01/14/2024   AST 27 01/14/2024   NA 139 01/14/2024   K 4.1 01/14/2024   CL 102 01/14/2024   CREATININE 1.15 01/14/2024   BUN 10 01/14/2024   CO2 22 01/14/2024   PSA 0.69 03/09/2015   HGBA1C 6.5 (H) 04/28/2024     Assessment & Plan:  Type 2 diabetes mellitus without complication, without long-term current use of insulin (HCC) Assessment & Plan: A1c has been at goal. Manage via lifestyle changes. A1c today.  Orders: -     Hemoglobin A1c -     Microalbumin / creatinine urine ratio  Hyperlipidemia, unspecified hyperlipidemia type Assessment & Plan: LDL at goal. Lipid panel today. Continue current medications.  Orders: -     Lipid panel  Irritable bowel syndrome with both constipation and diarrhea Assessment & Plan: Letter reviewed. I am in agreement. Letter signed today.    Follow-up:  6 months  Denea Cheaney Bluford DO [facility]

## 2024-08-09 NOTE — Assessment & Plan Note (Signed)
 Letter reviewed. I am in agreement. Letter signed today.

## 2024-08-09 NOTE — Assessment & Plan Note (Addendum)
 A1c has been at goal. Manage via lifestyle changes. A1c today.

## 2024-08-09 NOTE — Assessment & Plan Note (Signed)
 LDL at goal. Lipid panel today. Continue current medications.

## 2024-08-09 NOTE — Patient Instructions (Signed)
 Labs today.  Follow up in 6 months.

## 2024-08-11 LAB — HEMOGLOBIN A1C
Est. average glucose Bld gHb Est-mCnc: 143 mg/dL
Hgb A1c MFr Bld: 6.6 % — ABNORMAL HIGH (ref 4.8–5.6)

## 2024-08-11 LAB — MICROALBUMIN / CREATININE URINE RATIO
Creatinine, Urine: 237.5 mg/dL
Microalb/Creat Ratio: 11 mg/g{creat} (ref 0–29)
Microalbumin, Urine: 25.9 ug/mL

## 2024-08-11 LAB — LIPID PANEL
Chol/HDL Ratio: 1.8 ratio (ref 0.0–5.0)
Cholesterol, Total: 87 mg/dL — ABNORMAL LOW (ref 100–199)
HDL: 48 mg/dL (ref >39)
LDL Chol Calc (NIH): 20 mg/dL (ref 0–99)
Triglycerides: 100 mg/dL (ref 0–149)
VLDL Cholesterol Cal: 19 mg/dL (ref 5–40)

## 2024-08-14 ENCOUNTER — Ambulatory Visit: Payer: Self-pay | Admitting: Family Medicine

## 2024-09-15 ENCOUNTER — Other Ambulatory Visit: Payer: Self-pay | Admitting: Family Medicine

## 2024-09-15 DIAGNOSIS — R21 Rash and other nonspecific skin eruption: Secondary | ICD-10-CM

## 2024-10-07 ENCOUNTER — Encounter: Payer: Self-pay | Admitting: Family Medicine

## 2025-01-06 ENCOUNTER — Ambulatory Visit: Admitting: Family Medicine
